# Patient Record
Sex: Male | Born: 1965 | Race: White | Hispanic: No | Marital: Married | Smoking: Never smoker
Health system: Southern US, Community
[De-identification: ages and names within clinical notes are randomized; demographics above are authoritative.]

## PROBLEM LIST (undated history)

## (undated) DIAGNOSIS — G4700 Insomnia, unspecified: Secondary | ICD-10-CM

## (undated) DIAGNOSIS — E291 Testicular hypofunction: Secondary | ICD-10-CM

## (undated) DIAGNOSIS — R7989 Other specified abnormal findings of blood chemistry: Secondary | ICD-10-CM

## (undated) DIAGNOSIS — M545 Low back pain: Secondary | ICD-10-CM

## (undated) HISTORY — PX: HERNIA REPAIR: SHX51

## (undated) HISTORY — DX: Testicular hypofunction: E29.1

## (undated) HISTORY — DX: Other specified abnormal findings of blood chemistry: R79.89

## (undated) HISTORY — DX: Low back pain: M54.5

## (undated) HISTORY — PX: OTHER SURGICAL HISTORY: SHX169

## (undated) HISTORY — DX: Insomnia, unspecified: G47.00

---

## 1998-05-15 ENCOUNTER — Encounter: Admission: RE | Admit: 1998-05-15 | Discharge: 1998-07-03 | Payer: Self-pay

## 1998-07-14 ENCOUNTER — Encounter: Payer: Self-pay | Admitting: Neurosurgery

## 1998-07-14 ENCOUNTER — Ambulatory Visit (HOSPITAL_COMMUNITY): Admission: RE | Admit: 1998-07-14 | Discharge: 1998-07-14 | Payer: Self-pay | Admitting: Neurosurgery

## 1998-08-07 ENCOUNTER — Ambulatory Visit (HOSPITAL_COMMUNITY): Admission: RE | Admit: 1998-08-07 | Discharge: 1998-08-07 | Payer: Self-pay | Admitting: Neurosurgery

## 1998-08-07 ENCOUNTER — Encounter: Payer: Self-pay | Admitting: Neurosurgery

## 1998-08-28 ENCOUNTER — Ambulatory Visit (HOSPITAL_COMMUNITY): Admission: RE | Admit: 1998-08-28 | Discharge: 1998-08-28 | Payer: Self-pay | Admitting: Neurosurgery

## 1998-08-28 ENCOUNTER — Encounter: Payer: Self-pay | Admitting: Neurosurgery

## 2004-02-27 ENCOUNTER — Emergency Department (HOSPITAL_COMMUNITY): Admission: EM | Admit: 2004-02-27 | Discharge: 2004-02-27 | Payer: Self-pay | Admitting: Emergency Medicine

## 2004-03-28 ENCOUNTER — Encounter: Admission: RE | Admit: 2004-03-28 | Discharge: 2004-05-24 | Payer: Self-pay | Admitting: Family Medicine

## 2006-02-07 ENCOUNTER — Ambulatory Visit: Payer: Self-pay | Admitting: Family Medicine
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
## 2006-06-10 ENCOUNTER — Ambulatory Visit: Payer: Self-pay | Admitting: Internal Medicine

## 2006-07-01 ENCOUNTER — Ambulatory Visit: Payer: Self-pay | Admitting: Family Medicine

## 2006-07-01 LAB — CONVERTED CEMR LAB
PSA: 0.58 ng/mL (ref 0.10–4.00)
TSH: 1.73 microintl units/mL (ref 0.35–5.50)

## 2006-09-30 DIAGNOSIS — R7989 Other specified abnormal findings of blood chemistry: Secondary | ICD-10-CM | POA: Insufficient documentation

## 2006-09-30 DIAGNOSIS — G4700 Insomnia, unspecified: Secondary | ICD-10-CM

## 2006-09-30 HISTORY — DX: Other specified abnormal findings of blood chemistry: R79.89

## 2006-09-30 HISTORY — DX: Insomnia, unspecified: G47.00

## 2006-10-27 ENCOUNTER — Ambulatory Visit: Payer: Self-pay | Admitting: Family Medicine

## 2006-10-27 DIAGNOSIS — B356 Tinea cruris: Secondary | ICD-10-CM | POA: Insufficient documentation

## 2006-10-27 DIAGNOSIS — E291 Testicular hypofunction: Secondary | ICD-10-CM

## 2006-10-27 HISTORY — DX: Testicular hypofunction: E29.1

## 2006-10-28 ENCOUNTER — Encounter (INDEPENDENT_AMBULATORY_CARE_PROVIDER_SITE_OTHER): Payer: Self-pay | Admitting: Family Medicine

## 2006-10-28 LAB — CONVERTED CEMR LAB: Testosterone: 338.23 ng/dL — ABNORMAL LOW (ref 350.00–890)

## 2006-10-29 ENCOUNTER — Telehealth (INDEPENDENT_AMBULATORY_CARE_PROVIDER_SITE_OTHER): Payer: Self-pay | Admitting: *Deleted

## 2006-10-29 LAB — CONVERTED CEMR LAB
Sex Hormone Binding: 16 nmol/L (ref 13–71)
Testosterone Free: 76.1 pg/mL (ref 47.0–244.0)
Testosterone-% Free: 2.8 % (ref 1.6–2.9)
Testosterone: 274.9 ng/dL — ABNORMAL LOW (ref 350–890)

## 2007-03-26 ENCOUNTER — Encounter (INDEPENDENT_AMBULATORY_CARE_PROVIDER_SITE_OTHER): Payer: Self-pay | Admitting: Family Medicine

## 2007-07-02 ENCOUNTER — Telehealth (INDEPENDENT_AMBULATORY_CARE_PROVIDER_SITE_OTHER): Payer: Self-pay | Admitting: *Deleted

## 2007-07-20 ENCOUNTER — Telehealth: Payer: Self-pay | Admitting: Family Medicine

## 2007-11-10 ENCOUNTER — Telehealth (INDEPENDENT_AMBULATORY_CARE_PROVIDER_SITE_OTHER): Payer: Self-pay | Admitting: *Deleted

## 2007-11-16 ENCOUNTER — Telehealth (INDEPENDENT_AMBULATORY_CARE_PROVIDER_SITE_OTHER): Payer: Self-pay | Admitting: *Deleted

## 2007-12-18 ENCOUNTER — Ambulatory Visit: Payer: Self-pay | Admitting: Family Medicine

## 2007-12-18 ENCOUNTER — Telehealth (INDEPENDENT_AMBULATORY_CARE_PROVIDER_SITE_OTHER): Payer: Self-pay | Admitting: *Deleted

## 2007-12-18 DIAGNOSIS — M545 Low back pain, unspecified: Secondary | ICD-10-CM | POA: Insufficient documentation

## 2007-12-18 HISTORY — DX: Low back pain, unspecified: M54.50

## 2007-12-23 ENCOUNTER — Telehealth (INDEPENDENT_AMBULATORY_CARE_PROVIDER_SITE_OTHER): Payer: Self-pay | Admitting: *Deleted

## 2007-12-23 ENCOUNTER — Encounter (INDEPENDENT_AMBULATORY_CARE_PROVIDER_SITE_OTHER): Payer: Self-pay | Admitting: *Deleted

## 2007-12-23 LAB — CONVERTED CEMR LAB
ALT: 28 units/L (ref 0–53)
AST: 20 units/L (ref 0–37)
Albumin: 4.5 g/dL (ref 3.5–5.2)
Alkaline Phosphatase: 53 units/L (ref 39–117)
Bilirubin, Direct: 0.1 mg/dL (ref 0.0–0.3)
Indirect Bilirubin: 0.3 mg/dL (ref 0.0–0.9)
PSA: 0.54 ng/mL (ref 0.10–4.00)
Sex Hormone Binding: 16 nmol/L (ref 13–71)
Testosterone Free: 43 pg/mL — ABNORMAL LOW (ref 47.0–244.0)
Testosterone-% Free: 2.7 % (ref 1.6–2.9)
Testosterone: 160.79 ng/dL — ABNORMAL LOW (ref 350–890)
Total Bilirubin: 0.4 mg/dL (ref 0.3–1.2)
Total Protein: 7.2 g/dL (ref 6.0–8.3)

## 2007-12-24 ENCOUNTER — Encounter (INDEPENDENT_AMBULATORY_CARE_PROVIDER_SITE_OTHER): Payer: Self-pay | Admitting: *Deleted

## 2008-01-01 ENCOUNTER — Encounter: Payer: Self-pay | Admitting: Family Medicine

## 2008-03-15 ENCOUNTER — Telehealth (INDEPENDENT_AMBULATORY_CARE_PROVIDER_SITE_OTHER): Payer: Self-pay | Admitting: *Deleted

## 2008-04-12 ENCOUNTER — Ambulatory Visit: Payer: Self-pay | Admitting: Family Medicine

## 2008-04-12 DIAGNOSIS — J029 Acute pharyngitis, unspecified: Secondary | ICD-10-CM | POA: Insufficient documentation

## 2008-04-12 LAB — CONVERTED CEMR LAB: Rapid Strep: NEGATIVE

## 2008-04-14 ENCOUNTER — Encounter: Payer: Self-pay | Admitting: Family Medicine

## 2008-04-18 ENCOUNTER — Encounter (INDEPENDENT_AMBULATORY_CARE_PROVIDER_SITE_OTHER): Payer: Self-pay | Admitting: *Deleted

## 2008-07-27 ENCOUNTER — Ambulatory Visit: Payer: Self-pay | Admitting: Family Medicine

## 2008-07-27 DIAGNOSIS — J309 Allergic rhinitis, unspecified: Secondary | ICD-10-CM | POA: Insufficient documentation

## 2008-07-27 LAB — CONVERTED CEMR LAB: Rapid Strep: POSITIVE

## 2008-09-07 ENCOUNTER — Telehealth (INDEPENDENT_AMBULATORY_CARE_PROVIDER_SITE_OTHER): Payer: Self-pay | Admitting: *Deleted

## 2008-09-08 ENCOUNTER — Encounter: Payer: Self-pay | Admitting: Family Medicine

## 2008-10-10 ENCOUNTER — Telehealth (INDEPENDENT_AMBULATORY_CARE_PROVIDER_SITE_OTHER): Payer: Self-pay | Admitting: *Deleted

## 2009-01-31 ENCOUNTER — Telehealth: Payer: Self-pay | Admitting: Family Medicine

## 2009-04-05 ENCOUNTER — Ambulatory Visit: Payer: Self-pay | Admitting: Family Medicine

## 2010-04-04 ENCOUNTER — Encounter: Payer: Self-pay | Admitting: Family Medicine

## 2010-04-04 ENCOUNTER — Ambulatory Visit: Payer: Self-pay | Admitting: Family Medicine

## 2010-04-05 LAB — CONVERTED CEMR LAB
ALT: 31 units/L (ref 0–53)
AST: 24 units/L (ref 0–37)
Albumin: 4.3 g/dL (ref 3.5–5.2)
Alkaline Phosphatase: 46 units/L (ref 39–117)
BUN: 20 mg/dL (ref 6–23)
Basophils Absolute: 0.1 10*3/uL (ref 0.0–0.1)
Basophils Relative: 1.1 % (ref 0.0–3.0)
Bilirubin, Direct: 0.1 mg/dL (ref 0.0–0.3)
CO2: 33 meq/L — ABNORMAL HIGH (ref 19–32)
Calcium: 9.6 mg/dL (ref 8.4–10.5)
Chloride: 104 meq/L (ref 96–112)
Cholesterol: 216 mg/dL — ABNORMAL HIGH (ref 0–200)
Creatinine, Ser: 1.2 mg/dL (ref 0.4–1.5)
Direct LDL: 152.7 mg/dL
Eosinophils Absolute: 0.3 10*3/uL (ref 0.0–0.7)
Eosinophils Relative: 4.7 % (ref 0.0–5.0)
GFR calc non Af Amer: 68.48 mL/min (ref >60.00)
Glucose, Bld: 73 mg/dL (ref 70–99)
HCT: 42.3 % (ref 39.0–52.0)
HDL: 41.7 mg/dL (ref >39.00)
Hemoglobin: 14.7 g/dL (ref 13.0–17.0)
Lymphocytes Relative: 41.4 % (ref 12.0–46.0)
Lymphs Abs: 2.9 10*3/uL (ref 0.7–4.0)
MCHC: 34.8 g/dL (ref 30.0–36.0)
MCV: 96.3 fL (ref 78.0–100.0)
Monocytes Absolute: 0.6 10*3/uL (ref 0.1–1.0)
Monocytes Relative: 8.1 % (ref 3.0–12.0)
Neutro Abs: 3.1 10*3/uL (ref 1.4–7.7)
Neutrophils Relative %: 44.7 % (ref 43.0–77.0)
Platelets: 291 10*3/uL (ref 150.0–400.0)
Potassium: 4.1 meq/L (ref 3.5–5.1)
RBC: 4.39 M/uL (ref 4.22–5.81)
RDW: 13.1 % (ref 11.5–14.6)
Sodium: 142 meq/L (ref 135–145)
TSH: 1.33 microintl units/mL (ref 0.35–5.50)
Total Bilirubin: 1 mg/dL (ref 0.3–1.2)
Total CHOL/HDL Ratio: 5
Total Protein: 7 g/dL (ref 6.0–8.3)
Triglycerides: 118 mg/dL (ref 0.0–149.0)
VLDL: 23.6 mg/dL (ref 0.0–40.0)
WBC: 6.9 10*3/uL (ref 4.5–10.5)

## 2010-04-10 ENCOUNTER — Encounter: Payer: Self-pay | Admitting: Family Medicine

## 2010-04-10 ENCOUNTER — Ambulatory Visit
Admission: RE | Admit: 2010-04-10 | Discharge: 2010-04-10 | Payer: Self-pay | Source: Home / Self Care | Attending: Family Medicine | Admitting: Family Medicine

## 2010-04-12 LAB — CONVERTED CEMR LAB: Fecal Occult Bld: NEGATIVE

## 2010-05-15 NOTE — Letter (Signed)
Summary: Primary Care Consult Scheduled Letter  [name] [place]  [address] [place], [place] [address]   Phone: [phone]  Fax: [phone]      12/24/2007 MRN: [id number]  Raymond Decker [address] [place], [place]  [address]    Dear Raymond Decker,      We have scheduled an appointment for you.  At the recommendation of Dr.Lowne, we have scheduled you an appt  with  [facility]/ Dr. Ailene Rud on  September 17th at 10:30am. Their address is [address]nd [address]. The office phone number is [phone].  If this appointment day and time is not convenient for you, please feel free to call the office of the doctor you are being referred to at the number listed above and reschedule the appointment.     It is important for you to keep your scheduled appointments. We are here to make sure you are given good patient care. If you have questions or you have made changes to your appointment, please notify us at  [phone], ask for Tiffany.    Thank you,  Patient Care Coordinator [place] [facility]

## 2010-05-15 NOTE — Assessment & Plan Note (Signed)
Summary: acute only for a sore throat//ph   Vital Signs:  Patient Profile:   45 Years Old Male Weight:      182.6 pounds Temp:     97.4 degrees F oral Pulse rate:   90 / minute BP sitting:   100 / 72  (left arm)  Pt. in pain?   no  Vitals Entered By: Jeremy Johann CMA (April 12, 2008 3:57 PM)                  Chief Complaint:  sore throat.  History of Present Illness: Pt here c/o sore throat since Tuesday.  No other complaints.    Current Allergies (reviewed today): No known allergies   Past Medical History:    Reviewed history from 12/18/2007 and no changes required:       Current Problems:        BACK PAIN, LUMBAR (ICD-724.2)       TINEA CRURIS (ICD-110.3)       HYPOGONADISM, MALE (ICD-257.2)       INSOMNIA (ICD-780.52)       HYPERGLYCEMIA (ICD-790.6)            Review of Systems      See HPI   Physical Exam  General:     Well-developed,well-nourished,in no acute distress; alert,appropriate and cooperative throughout examination Head:     Normocephalic and atraumatic without obvious abnormalities. No apparent alopecia or balding. Ears:     External ear exam shows no significant lesions or deformities.  Otoscopic examination reveals clear canals, tympanic membranes are intact bilaterally without bulging, retraction, inflammation or discharge. Hearing is grossly normal bilaterally. Nose:     External nasal examination shows no deformity or inflammation. Nasal mucosa are pink and moist without lesions or exudates. Mouth:     pharyngeal erythema and postnasal drip.   Neck:     No deformities, masses, or tenderness noted. Lungs:     Normal respiratory effort, chest expands symmetrically. Lungs are clear to auscultation, no crackles or wheezes. Heart:     Normal rate and regular rhythm. S1 and S2 normal without gallop, murmur, click, rub or other extra sounds. Skin:     Intact without suspicious lesions or rashes Cervical Nodes:     No  lymphadenopathy noted Psych:     Cognition and judgment appear intact. Alert and cooperative with normal attention span and concentration. No apparent delusions, illusions, hallucinations    Impression & Recommendations:  Problem # 1:  ACUTE PHARYNGITIS (ICD-462)  His updated medication list for this problem includes:    Amoxicillin 875 Mg Tabs (Amoxicillin) .[name]... 1 by mouth two times a day  Orders: T-Culture, Throat ([address]) Rapid Strep ([address]) Instructed to complete antibiotics and call if not improved in 48 hours.   Complete Medication List: 1)  Lunesta 3 Mg Tabs (Eszopiclone) 2)  Testim 1 % Gel (Testosterone) .... Use as directed 3)  Amoxicillin 875 Mg Tabs (Amoxicillin) .[name].. 1 by mouth two times a day    Prescriptions: AMOXICILLIN 875 MG TABS (AMOXICILLIN) 1 by mouth two times a day  #20 x 0   Entered and Authorized by:   Loreen Freud DO   Signed by:   Loreen Freud DO on 04/12/2008   Method used:   [facility]        [facility] [phone]* (retail)       [address]       [place],  [place]  [address]       Ph: [phone]       Fax: [phone]   RxID:   [email or web address]  ] Laboratory Results   Date/Time Reported: April 12, 2008 4:11 PM  Other Tests  Rapid Strep: negative   Appended Document: acute only for a sore throat//ph

## 2010-05-15 NOTE — Progress Notes (Signed)
  Phone Note From Pharmacy   Summary of Call: Pharmacy called office with patient there and he cannot afford the androgel and doesn't want to use the patch.  Patient wants to wait until labs come back to decide what to do.  Dr. Laury Axon okay with this.  Ardyth Man  December 18, 2007 4:33 PM  Initial call taken by: Ardyth Man,  December 18, 2007 4:33 PM

## 2010-05-15 NOTE — Letter (Signed)
Summary: Results Follow-up Letter  [place] [facility]  [address] [place], [place] [address]   Phone: [phone]  Fax: [phone]    04/18/2008        Ulysess Witz [address] [place], [place]  [address]  Dear Mr. Dubach,   The following are the results of your recent test(s):  Test     Result     Pap Smear    Normal_______  Not Normal_____       Comments: _________________________________________________________ Cholesterol LDL(Bad cholesterol):          Your goal is less than:         HDL (Good cholesterol):        Your goal is more than: _________________________________________________________ Other Tests:   _________________________________________________________  Please call for an appointment Or Please see attached Normal Throat Culture._________________________________________________________ _________________________________________________________ _________________________________________________________  Sincerely,  Felecia Deloach CMA [place] [facility]

## 2010-05-15 NOTE — Letter (Signed)
Summary: Results Follow up Letter  [place] [place]  [address] [place], [place] [address]   Phone: [phone]  Fax: [phone]    12/23/2007 MRN: [id number]  Raymond Decker [address] [place], [place]  [address]  Dear Mr. Hoefling,  The following are the results of your recent test(s):  Test         Result    Pap Smear:        Normal _____  Not Normal _____ Comments: ______________________________________________________ Cholesterol: LDL(Bad cholesterol):         Your goal is less than:         HDL (Good cholesterol):       Your goal is more than: Comments:  ______________________________________________________ Mammogram:        Normal _____  Not Normal _____ Comments:  ___________________________________________________________________ Hemoccult:        Normal _____  Not normal _______ Comments:    _____________________________________________________________________ Other Tests:Please see attached labs from 12-18-07. We tried to contact you but was unable to reach you    We routinely do not discuss normal results over the telephone.  If you desire a copy of the results, or you have any questions about this information we can discuss them at your next office visit.   Sincerely,

## 2010-05-15 NOTE — Progress Notes (Signed)
Summary: REFILL  Phone Note Refill Request Message from:  [facility] FAX [phone]  Refills Requested: Medication #1:  LUNESTA 3 MG  TABS Initial call taken by: Barb Merino,  October 10, 2008 10:36 AM  Follow-up for Phone Call        please advise in absent of dr Laury Axon  last OV 07-27-08, last filled #30 3 03-16-08.....Marland KitchenMarland KitchenFelecia Deloach CMA  October 11, 2008 10:33 AM  Additional Follow-up for Phone Call Additional follow up Details #1::        ok for #30, no refills. Additional Follow-up by: Neena Rhymes MD,  October 11, 2008 10:34 AM      Prescriptions: Alfonso Patten 3 MG  TABS (ESZOPICLONE)   #30 x 0   Entered by:   Jeremy Johann CMA   Authorized by:   Neena Rhymes MD   Signed by:   Jeremy Johann CMA on 10/11/2008   Method used:   Printed then faxed to ...       [facility] [phone]* (retail)       [address]       [place], [place]  [address]       Ph: [phone]       Fax: [phone]   RxID:   [id number]

## 2010-05-15 NOTE — Medication Information (Signed)
Summary: escript prescription  escript prescription   Imported By: Freddy Jaksch 04/15/2007 10:24:14  _____________________________________________________________________  External Attachment:    Type:   Image     Comment:   External Document

## 2010-05-15 NOTE — Progress Notes (Signed)
Summary: LOWNE--REFILL lunesta  Phone Note Refill Request   Refills Requested: Medication #1:  LUNESTA 3 MG  TABS [facility] [address][phone]--LAST FILLED-12/10/07  Initial call taken by: Freddy Jaksch,  March 15, 2008 9:08 AM  Follow-up for Phone Call        last refill #30 3 refill 10/27/06 last ov 12/18/07 .Kandice Hams  March 15, 2008 2:34 PM  Follow-up by: Kandice Hams,  March 15, 2008 2:34 PM  Additional Follow-up for Phone Call Additional follow up Details #1::        ok Additional Follow-up by: Loreen Freud DO,  March 15, 2008 5:31 PM      Prescriptions: LUNESTA 3 MG  TABS (ESZOPICLONE)   #30 x 3   Entered by:   Kandice Hams   Authorized by:   Loreen Freud DO   Signed by:   Kandice Hams on 03/16/2008   Method used:   Printed then faxed to ...       [facility] [phone]* (retail)       [address]       [place], [place]  [address]       Ph: [phone]       Fax: [phone]   RxID:   [id number]

## 2010-05-15 NOTE — Progress Notes (Signed)
Summary: LUNESTA//LOWNE  Phone Note Refill Request Message from:  Pharmacy [facility] [phone]  Refills Requested: Medication #1:  LUNESTA 3 MG  TABS LAST REFILL #30 X 0 ON 10/11/08, LAST OV 07/27/08.  PHARMACY COMMENTS: HAS ONLY 10 TABS LEFT ON REFILL--NEEDS REFILL  Initial call taken by: Kandice Hams,  January 31, 2009 9:44 AM  Follow-up for Phone Call        ok to refill x1 Follow-up by: Loreen Freud DO,  January 31, 2009 10:13 AM    Prescriptions: LUNESTA 3 MG  TABS (ESZOPICLONE)   #30 x 0   Entered by:   Jeremy Johann CMA   Authorized by:   Loreen Freud DO   Signed by:   Jeremy Johann CMA on 01/31/2009   Method used:   Printed then faxed to ...       [facility] [phone]* (retail)       [address]       [place], [place]  [address]       Ph: [phone]       Fax: [phone]   RxID:   [id number]

## 2010-05-15 NOTE — Assessment & Plan Note (Signed)
Summary: rash on thigh.cbs   Vital Signs:  Patient Profile:   45 Years Old Male Weight:      174.38 pounds Temp:     98.2 degrees F oral Pulse rate:   68 / minute Resp:     14 per minute BP sitting:   120 / 80  (right arm)  Pt. in pain?   no  Vitals Entered By: Ardyth Man (October 27, 2006 12:48 PM)                Chief Complaint:  Rash and pt. thinks jock itch.  History of Present Illness: Patient reports he started jogging regularly last several months. Noticed a pruritic groin rash. Has used OTC antifungal and hydrocortisone with some relieve, but it recurs   Also has  history of hypogonadism. Using Androgel daily without side effects. Need refill  Also history of insomnia and currently on Lunesta with good results. No significant side effects   History of anxiety. Taking celexa regularly. Would like refill.  Stable on current dose.        Physical Exam  General:     Well-developed,well-nourished,in no acute distress; alert,appropriate and cooperative throughout examination Skin:     mildly erythematous macular/papular rash inquinal crease and groin Psych:     Cognition and judgment appear intact. Alert and cooperative with normal attention span and concentration. No apparent delusions, illusions, hallucinations    Impression & Recommendations:  Problem # 1:  TINEA CRURIS (ICD-110.3) 1. Diflucan 150 mg weekly for 3 weeks. 2. May use Gold Balm Medicated after rash resolves to decrease moisture 3. F/u if no improvement  Problem # 2:  HYPOGONADISM, MALE (ICD-257.2) Refill Androgel Orders: TLB-Testosterone, Total (84403-TESTO)   Problem # 3:  INSOMNIA (ICD-780.52) Stable His updated medication list for this problem includes:    Lunesta 3 Mg Tabs (Eszopiclone)   Medications Added to Medication List This Visit: 1)  Diflucan 150 Mg Tabs (Fluconazole) .[name].. 1 by mouth weekly for 3 weeks     Prescriptions: DIFLUCAN 150 MG  TABS (FLUCONAZOLE) 1  by mouth weekly for 3 weeks  #3 x 0   Entered and Authorized by:   Leanne Chang MD   Signed by:   Leanne Chang MD on 10/27/2006   Method used:   Print then Give to Patient   RxID:   [id number] ANDROGEL PUMP 1 %  GEL (TESTOSTERONE)   #QS x 6   Entered and Authorized by:   Leanne Chang MD   Signed by:   Leanne Chang MD on 10/27/2006   Method used:   Print then Give to Patient   RxID:   [id number] LUNESTA 3 MG  TABS (ESZOPICLONE)   #30 x 3   Entered and Authorized by:   Leanne Chang MD   Signed by:   Leanne Chang MD on 10/27/2006   Method used:   Print then Give to Patient   RxID:   [id number] CITALOPRAM HYDROBROMIDE 20 MG TABS (CITALOPRAM HYDROBROMIDE)   #30 x 6   Entered and Authorized by:   Leanne Chang MD   Signed by:   Leanne Chang MD on 10/27/2006   Method used:   Print then Give to Patient   RxID:   [id number]

## 2010-05-15 NOTE — Assessment & Plan Note (Signed)
Summary: acute - sore throat temp/cbs   Vital Signs:  Patient profile:   45 year old male Weight:      179.6 pounds Temp:     98.5 degrees F oral BP sitting:   118 / 80  (left arm)  Vitals Entered By: Doristine Devoid (July 27, 2008 9:36 AM) CC: sore throat xyesterday and fever of 103    History of Present Illness: 45 yo man here today w/ sore throat and fever to 103 yesterday.  pt w/ multiple sick contacts.  + HA, nasal congestion, mild nausea.  Allergies: No Known Drug Allergies  Review of Systems      See HPI  Physical Exam  General:  Well-developed,well-nourished,in no acute distress; alert,appropriate and cooperative throughout examination Head:  Normocephalic and atraumatic without obvious abnormalities. No apparent alopecia or balding. Ears:  TMs retracted bilaterally Nose:  markedly edematous turbinates bilaterally, clear nasal drainage Mouth:  tonsilar edema, erythema, exudates Neck:  No deformities, masses, or tenderness noted. Lungs:  Normal respiratory effort, chest expands symmetrically. Lungs are clear to auscultation, no crackles or wheezes. Heart:  Normal rate and regular rhythm. S1 and S2 normal without gallop, murmur, click, rub or other extra sounds.   Impression & Recommendations:  Problem # 1:  ACUTE PHARYNGITIS (ICD-462) Assessment Unchanged  pt w/ + strep test in office.  shot of bicillin given.  reviewed supportive care and red flags that should prompt return.  Pt expresses understanding and is in agreement w/ this plan.  Orders: Admin of Therapeutic Inj  intramuscular or subcutaneous ([address]) Bicillin LA 1.2 million units Injection (J0570) Rapid Strep ([address])  Problem # 2:  RHINITIS (ICD-477.9) Assessment: New markedly edematous turbinates, start nasal steroid spray. His updated medication list for this problem includes:    Fluticasone Propionate 50 Mcg/act Susp (Fluticasone propionate) .[name]... 2 sprays each nostril once daily  Complete  Medication List: 1)  Lunesta 3 Mg Tabs (Eszopiclone) 2)  Testim 1 % Gel (Testosterone) .... Use as directed 3)  Fluticasone Propionate 50 Mcg/act Susp (Fluticasone propionate) .... 2 sprays each nostril once daily  Patient Instructions: 1)  The shot you got today should take care of the strep- if no improvement in the next few days please call 2)  Tylenol alternating w/ ibuprofen for pain and fever 3)  Use the steroid nasal spray as directed to help decrease drainage and congestion 4)  Plenty of fluids 5)  Call with any questions or concerns 6)  Hang in there! Prescriptions: FLUTICASONE PROPIONATE 50 MCG/ACT  SUSP (FLUTICASONE PROPIONATE) 2 sprays each nostril once daily  #1 x 3   Entered and Authorized by:   Neena Rhymes MD   Signed by:   Neena Rhymes MD on 07/27/2008   Method used:   Electronically to        [facility] [phone]* (retail)       [address]       [place], [place]  [address]       Ph: [phone]       Fax: [phone]   RxID:   [id number]    Medication Administration  Injection # 1:    Medication: Bicillin LA 1.2 million units Injection    Diagnosis: ACUTE PHARYNGITIS (ICD-462)    Route: IM    Site: RUOQ gluteus    Exp Date: 01/14/2011    Lot #: [id number]    Mfr: [employer]    Given by: Tama Gander  Yetta Barre (July 27, 2008 9:56 AM)  Orders Added: 1)  Admin of Therapeutic Inj  intramuscular or subcutaneous [[address]] 2)  Bicillin LA 1.2 million units Injection [J0570] 3)  Est. Patient Level III [[address]] 4)  Rapid Strep [[address]]  Laboratory Results    Other Tests  Rapid Strep: positive

## 2010-05-15 NOTE — Assessment & Plan Note (Signed)
Summary: REFILL ON MEDS/CDJ   Vital Signs:  Patient Profile:   45 Years Old Male Weight:      182.50 pounds Temp:     98.2 degrees F oral Pulse rate:   72 / minute Resp:     16 per minute BP sitting:   110 / 80  (left arm)  Pt. in pain?   no  Vitals Entered By: Ardyth Man (December 18, 2007 3:24 PM)                  Chief Complaint:  med refill.  History of Present Illness: Pt here for med refill.  He does not like the patch and would like to go back to the pump--androgel.  he is c/o fatigue and decreased libido.    Pt also c/o Low back pain after running at beach.     Current Allergies: No known allergies   Past Medical History:    Current Problems:     BACK PAIN, LUMBAR (ICD-724.2)    TINEA CRURIS (ICD-110.3)    HYPOGONADISM, MALE (ICD-257.2)    INSOMNIA (ICD-780.52)    HYPERGLYCEMIA (ICD-790.6)        Risk Factors: Tobacco use:  never Alcohol use:  yes   Review of Systems      See HPI   Physical Exam  General:     Well-developed,well-nourished,in no acute distress; alert,appropriate and cooperative throughout examination Lungs:     Normal respiratory effort, chest expands symmetrically. Lungs are clear to auscultation, no crackles or wheezes. Heart:     normal rate and regular rhythm.   Msk:     normal ROM, no joint tenderness, no joint swelling, no joint warmth, no redness over joints, no joint deformities, no joint instability, and no crepitation.   Extremities:     No clubbing, cyanosis, edema, or deformity noted with normal full range of motion of all joints.   Neurologic:     alert & oriented X3, cranial nerves II-XII intact, gait normal, and DTRs symmetrical and normal.   Skin:     Intact without suspicious lesions or rashes Psych:     Oriented X3 and memory intact for recent and remote.      Impression & Recommendations:  Problem # 1:  HYPOGONADISM, MALE (ICD-257.2)  Orders: Venipuncture ([address]) T- * Misc. Laboratory test  [phone])   Problem # 2:  BACK PAIN, LUMBAR (ICD-724.2)  His updated medication list for this problem includes:    Flexeril 10 Mg Tabs (Cyclobenzaprine hcl) .[name]... 1 by mouth three times a day as needed  Orders: T-Lumbar Spine Complete, 5 Views [phone]) Discussed use of moist heat or ice, modified activities, medications, and stretching/strengthening exercises. Back care instructions given. To be seen in 2 weeks if no improvement; sooner if worsening of symptoms.   Complete Medication List: 1)  Lunesta 3 Mg Tabs (Eszopiclone) 2)  Flexeril 10 Mg Tabs (Cyclobenzaprine hcl) .[name].. 1 by mouth three times a day as needed    Prescriptions: FLEXERIL 10 MG TABS (CYCLOBENZAPRINE HCL) 1 by mouth three times a day as needed  #30 x 0   Entered and Authorized by:   Loreen Freud DO   Signed by:   Loreen Freud DO on 12/18/2007   Method used:   Printed then faxed to ...       [facility] [phone]* (retail)       [address]       [place], [place]  [address]       Ph: [phone]       Fax: [phone]   RxID:   [id number] ANDROGEL PUMP 1 % GEL (TESTOSTERONE) 4  pumps as directed daily  #1 month x 5   Entered and Authorized by:   Loreen Freud DO   Signed by:   Loreen Freud DO on 12/18/2007   Method used:   Print then Give to Patient   RxID:   [id number]  ]

## 2010-05-15 NOTE — Medication Information (Signed)
Summary: Prior Authorization & Approval for Lunesta/[employer]  Prior Authorization & Approval for Lunesta/[employer]   Imported By: Lanelle Bal 09/14/2008 09:31:52  _____________________________________________________________________  External Attachment:    Type:   Image     Comment:   External Document

## 2010-05-15 NOTE — Progress Notes (Signed)
Summary: change medication/DR LOWNE SEE  Phone Note Call from Patient Call back at [phone]   Caller: Patient Reason for Call: Talk to Nurse Summary of Call: dr. Blossom Hoops patient wanted to know if ANDROGEL PUMP 1 % could be changed to androderm or testim. one of these medications would be cheaper for the patient.  (see 07/02/07 phone note Initial call taken by: Charolette Child,  July 20, 2007 2:47 PM  Follow-up for Phone Call        Is this okay with you?  ...................................................................Ardyth Man  July 20, 2007 4:32 PM  Follow-up by: Ardyth Man,  July 20, 2007 4:32 PM  Additional Follow-up for Phone Call Additional follow up Details #1::        androderm patch 5 mg   1 patch at bedtime   #30---  need to recheck level in 1-2 months----  free testosterone, PSA  257.2 Additional Follow-up by: Loreen Freud DO,  July 20, 2007 4:56 PM    Additional Follow-up for Phone Call Additional follow up Details #2::    Patient aware rx called to pharmacy ...................................................................Ardyth Man  July 20, 2007 5:11 PM  Follow-up by: Ardyth Man,  July 20, 2007 5:11 PM  New/Updated Medications: ANDRODERM 5 MG/24HR  PT24 (TESTOSTERONE) Apply one patch at bedtime   Prescriptions: ANDRODERM 5 MG/24HR  PT24 (TESTOSTERONE) Apply one patch at bedtime  #30 x 1   Entered by:   Ardyth Man   Authorized by:   Loreen Freud DO   Signed by:   Ardyth Man on 07/20/2007   Method used:   Telephoned to ...       [facility] [phone]*       [address]       [place], [place]  [address]       Ph: [phone]       Fax: [phone]   RxID:   [id number]

## 2010-05-15 NOTE — Consult Note (Signed)
Summary: [facility]  [facility]   Imported By: Lanelle Bal 01/14/2008 13:08:08  _____________________________________________________________________  External Attachment:    Type:   Image     Comment:   External Document

## 2010-05-15 NOTE — Progress Notes (Signed)
Summary: PRIOR AUTH APPROVED LUNESTA// BCBS  Phone Note Refill Request Message from:  [facility]fax#[phone]  Refills Requested: Medication #1:  LUNESTA 3 MG  TABS please submit a prior authorization request [id number] [employer]BS [id number]  Initial call taken by: Barb Merino,  Sep 07, 2008 2:15 PM  Follow-up for Phone Call        PRIOR AUTH IN PROCESS AWAITING FAX..............Marland KitchenFelecia Deloach CMA  Sep 08, 2008 9:01 AM PRIOR AUTH FAXED BACK AWAITING RESPONSE.........Marland KitchenFelecia Deloach CMA  Sep 08, 2008 11:08 AM  Additional Follow-up for Phone Call Additional follow up Details #1::        LUNESTA APPROVED 09-09-08 TO 06-05-11,  APPROVAL LETTER FAXED TO PHARMACY,SCAN TO CHART.........Marland KitchenFelecia Deloach CMA  Sep 08, 2008 2:44 PM

## 2010-05-15 NOTE — Progress Notes (Signed)
  Phone Note Outgoing Call   Call placed by: Ardyth Man,  October 29, 2006 4:36 PM Call placed to: Patient Summary of Call: [facility] ...................................................................Ardyth Man  October 29, 2006 4:36 PM   Follow-up for Phone Call        Pt. aware ...................................................................Ardyth Man  October 31, 2006 12:06 PM  Follow-up by: Ardyth Man,  October 31, 2006 12:06 PM

## 2010-05-15 NOTE — Progress Notes (Signed)
Summary: LOWNE--REFILL  Phone Note Refill Request   Refills Requested: Medication #1:  CITALOPRAM HYDROBROMIDE 20 MG TABS [facility] [address][phone]  Initial call taken by: Freddy Jaksch,  November 16, 2007 3:21 PM      Prescriptions: CITALOPRAM HYDROBROMIDE 20 MG TABS (CITALOPRAM HYDROBROMIDE)   #30 x 0   Entered by:   Kandice Hams   Authorized by:   Loreen Freud DO   Signed by:   Kandice Hams on 11/16/2007   Method used:   Telephoned to ...       [facility] [phone]*       [address]       [place], [place]  [address]       Ph: [phone]       Fax: [phone]   RxID:   [id number]

## 2010-05-15 NOTE — Progress Notes (Signed)
Summary: different rx- dr Blossom Hoops  Phone Note Call from Patient Call back at [phone]   Caller: Patient Summary of Call: rx was called in for androgel but his ins doesnt cover - he wants to know if somthing else can be called in  Initial call taken by: Okey Regal [place],  July 02, 2007 5:06 PM  Follow-up for Phone Call        Spoke with pharmacist Lennice Sites [facility] [address] insurance did not deny rx it was run through and was Not Denied, med dis cost pt $230.00 where in Nov med cost him $30.00. Could be possible change in formulary, pharmacist said she did tell pt to check with his ins co [facility] to find out, but ins did not deny med just a higher copay...Marland KitchenMarland KitchenPt informed of this and he will call [facility] is hthere a change in formulary? if so what is comparable that is cheaper and let us know...................................................................Marland KitchenKandice Hams  July 02, 2007 5:49 PM   Follow-up by: Kandice Hams,  July 02, 2007 5:45 PM

## 2010-05-15 NOTE — Progress Notes (Signed)
Summary: call-12-23-07/fd  Phone Note Outgoing Call Call back at [facility] Phone [phone] Call back at Work Phone [phone]   Call placed by: [facility] CMA,  December 23, 2007 2:49 PM Call placed to: Patient Summary of Call: tried to call pt phone disconnected and no one by that name at work #. Mail labs to pt  Initial call taken by: [facility] CMA,  December 23, 2007 2:52 PM

## 2010-05-15 NOTE — Progress Notes (Signed)
Summary: lowne--refill  Phone Note Refill Request   Refills Requested: Medication #1:  CITALOPRAM HYDROBROMIDE 20 MG TABS  Medication #2:  ANDRODERM 5 MG/24HR  PT24 Apply one patch at bedtime [facility] [address][phone] [phone]  Initial call taken by: Freddy Jaksch,  November 10, 2007 8:35 AM      Prescriptions: CITALOPRAM HYDROBROMIDE 20 MG TABS (CITALOPRAM HYDROBROMIDE)   #30 x 6   Entered by:   Kandice Hams   Authorized by:   Loreen Freud DO   Signed by:   Kandice Hams on 11/10/2007   Method used:   Electronically sent to ...       [facility] [phone]*       [address]       [place], [place]  [address]       Ph: [phone]       Fax: [phone]   RxID:   [id number]

## 2010-05-17 NOTE — Letter (Signed)
Summary: [name]: Immunoassay Fecal Occult Blood (iFOB) Order Form  [facility] [place]  [address] [place], [place] [address]   Phone: [phone]  Fax: [phone]      [facility]: Immunoassay Fecal Occult Blood (iFOB) Order Form   April 10, 2010 MRN: [id number]   Raymond Decker [birth year]   Physicican Name: Dr.Lowne  Diagnosis Code: v56.71      [name] CMA (AAMA)

## 2010-05-17 NOTE — Assessment & Plan Note (Signed)
Summary: cpx/cbs   Vital Signs:  Patient profile:   45 year old male Height:      68 inches Weight:      176.2 pounds BMI:     26.89 Pulse rate:   80 / minute Pulse rhythm:   regular BP sitting:   112 / 70  (left arm) Cuff size:   large  Vitals Entered By: [name] CMA Duncan Dull) (April 04, 2010 10:09 AM) CC: CPX/Fasting--- no problems   History of Present Illness: Pt here for cpe and labs.  Pt only complaint is he is not sleeping again.  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    Preventive Screening-Counseling & Management  Alcohol-Tobacco     Alcohol drinks/day: <1     Smoking Status: never  Caffeine-Diet-Exercise     Caffeine use/day: 5     Does Patient Exercise: yes     Type of exercise: cardio, weights      Exercise (avg: min/session): >60     Times/week: 5  Hep-HIV-STD-Contraception     Dental Visit-last 6 months yes     Dental Care Counseling: to seek dental care; no dental care within six months      Sexual History:  currently monogamous.    Current Medications (verified): 1)  Androgel Pump 1.25 Gm/act (1%) Gel (Testosterone) .... Use As Directed 2)  Fluticasone Propionate 50 Mcg/act  Susp (Fluticasone Propionate) .... 2 Sprays Each Nostril Once Daily 3)  Ambien 10 Mg Tabs (Zolpidem Tartrate) .[name].. 1 By Mouth At Bedtime As Needed  Allergies (verified): No Known Drug Allergies  Past History:  Past Medical History: Last updated: 12/18/2007 Current Problems:  BACK PAIN, LUMBAR (ICD-724.2) TINEA CRURIS (ICD-110.3) HYPOGONADISM, MALE (ICD-257.2) INSOMNIA (ICD-780.52) HYPERGLYCEMIA (ICD-790.6)  Family History: Last updated: 09/30/2006 Family History Other cancer-pancreatic  Social History: Last updated: 09/30/2006 Married Never Smoked Alcohol use-yes-rare  Risk Factors: Alcohol Use: <1 (04/04/2010) Caffeine Use: 5 (04/04/2010) Exercise: yes (04/04/2010)  Risk Factors: Smoking Status: never (04/04/2010)  Family  History: Reviewed history from 09/30/2006 and no changes required. Family History Other cancer-pancreatic  Social History: Reviewed history from 09/30/2006 and no changes required. Married Never Smoked Alcohol use-yes-rare Does Patient Exercise:  yes Caffeine use/day:  5 Dental Care w/in 6 mos.:  yes Sexual History:  currently monogamous  Review of Systems      See HPI General:  Denies chills, fatigue, fever, loss of appetite, malaise, sleep disorder, sweats, weakness, and weight loss. Eyes:  Denies blurring, discharge, double vision, eye irritation, eye pain, halos, itching, light sensitivity, red eye, vision loss-1 eye, and vision loss-both eyes; optho--q2y. ENT:  Denies decreased hearing, difficulty swallowing, ear discharge, earache, hoarseness, nasal congestion, nosebleeds, postnasal drainage, ringing in ears, sinus pressure, and sore throat. CV:  Denies bluish discoloration of lips or nails, chest pain or discomfort, difficulty breathing at night, difficulty breathing while lying down, fainting, fatigue, leg cramps with exertion, lightheadness, near fainting, palpitations, shortness of breath with exertion, swelling of feet, swelling of hands, and weight gain. Resp:  Denies chest discomfort, chest pain with inspiration, cough, coughing up blood, excessive snoring, hypersomnolence, morning headaches, pleuritic, shortness of breath, sputum productive, and wheezing. GI:  Denies abdominal pain, bloody stools, change in bowel habits, constipation, dark tarry stools, diarrhea, excessive appetite, gas, hemorrhoids, indigestion, loss of appetite, nausea, vomiting, vomiting blood, and yellowish skin color. GU:  Denies decreased libido, discharge, dysuria, erectile dysfunction, genital sores, hematuria, incontinence, nocturia, urinary frequency, and urinary hesitancy. MS:  Denies joint pain, joint redness, joint swelling, loss of strength, low back pain, mid back pain, muscle aches, muscle ,  cramps, muscle weakness, stiffness, and thoracic pain. Derm:  Denies changes in color of skin, changes in nail beds, dryness, excessive perspiration, flushing, hair loss, insect bite(s), itching, lesion(s), poor wound healing, and rash; derm --[place] derm . Neuro:  Denies brief paralysis, difficulty with concentration, disturbances in coordination, falling down, headaches, inability to speak, memory loss, numbness, poor balance, seizures, sensation of room spinning, tingling, tremors, visual disturbances, and weakness. Psych:  Denies alternate hallucination ( auditory/visual), anxiety, depression, easily angered, easily tearful, irritability, mental problems, panic attacks, sense of great danger, suicidal thoughts/plans, thoughts of violence, unusual visions or sounds, and thoughts /plans of harming others. Endo:  Denies cold intolerance, excessive hunger, excessive thirst, excessive urination, heat intolerance, polyuria, and weight change. Heme:  Denies abnormal bruising, bleeding, enlarge lymph nodes, fevers, pallor, and skin discoloration. Allergy:  Denies hives or rash, itching eyes, persistent infections, seasonal allergies, and sneezing.  Physical Exam  General:  Well-developed,well-nourished,in no acute distress; alert,appropriate and cooperative throughout examination Head:  Normocephalic and atraumatic without obvious abnormalities. No apparent alopecia or balding. Eyes:  pupils equal, pupils round, pupils reactive to light, and no injection.   Ears:  External ear exam shows no significant lesions or deformities.  Otoscopic examination reveals clear canals, tympanic membranes are intact bilaterally without bulging, retraction, inflammation or discharge. Hearing is grossly normal bilaterally. Nose:  External nasal examination shows no deformity or inflammation. Nasal mucosa are pink and moist without lesions or exudates. Mouth:  Oral mucosa and oropharynx without lesions or exudates.  Teeth  in good repair. Neck:  No deformities, masses, or tenderness noted. Chest Wall:  No deformities, masses, tenderness or gynecomastia noted. Lungs:  Normal respiratory effort, chest expands symmetrically. Lungs are clear to auscultation, no crackles or wheezes. Heart:  normal rate and no murmur.   Abdomen:  Bowel sounds positive,abdomen soft and non-tender without masses, organomegaly or hernias noted. Rectal:  No external abnormalities noted. Normal sphincter tone. No rectal masses or tenderness. Heme neg brown stool Genitalia:  Testes bilaterally descended without nodularity, tenderness or masses. No scrotal masses or lesions. No penis lesions or urethral discharge. Prostate:  Prostate gland firm and smooth, no enlargement, nodularity, tenderness, mass, asymmetry or induration. Msk:  normal ROM, no joint tenderness, no joint swelling, no joint warmth, no redness over joints, no joint deformities, no joint instability, and no crepitation.   Pulses:  R femoral normal, R posterior tibial normal, R dorsalis pedis normal, R carotid normal, L posterior tibial normal, L dorsalis pedis normal, and L carotid normal.   Extremities:  No clubbing, cyanosis, edema, or deformity noted with normal full range of motion of all joints.   Neurologic:  alert & oriented X3, strength normal in all extremities, and gait normal.   Skin:  Intact without suspicious lesions or rashes Cervical Nodes:  No lymphadenopathy noted Psych:  Cognition and judgment appear intact. Alert and cooperative with normal attention span and concentration. No apparent delusions, illusions, hallucinations   Impression & Recommendations:  Problem # 1:  PREVENTIVE HEALTH CARE (ICD-V70.0)  Orders: Venipuncture ([address]) TLB-Lipid Panel (80061-LIPID) TLB-BMP (Basic Metabolic Panel-BMET) (80048-METABOL) TLB-CBC Platelet - w/Differential (85025-CBCD) TLB-Hepatic/Liver Function Pnl (80076-HEPATIC) TLB-TSH (Thyroid Stimulating Hormone)  (84443-TSH) EKG w/ Interpretation (93000)  Reviewed preventive care protocols, scheduled due services, and updated immunizations.  Problem # 2:  HYPOGONADISM, MALE (ICD-257.2) per urology  Problem # 3:  INSOMNIA (ICD-780.52)  The following medications were removed from the medication list:    Lunesta 3 Mg Tabs (Eszopiclone) His updated medication list for this problem includes:    Ambien 10 Mg Tabs (Zolpidem tartrate) .[name]... 1 by mouth at bedtime as needed  Discussed sleep hygiene.   Problem # 4:  HYPERGLYCEMIA (ICD-790.6)  Complete Medication List: 1)  Androgel Pump 1.25 Gm/act (1%) Gel (Testosterone) .... Use as directed 2)  Fluticasone Propionate 50 Mcg/act Susp (Fluticasone propionate) .... 2 sprays each nostril once daily 3)  Ambien 10 Mg Tabs (Zolpidem tartrate) .[name].. 1 by mouth at bedtime as needed  Patient Instructions: 1)  Please schedule a follow-up appointment in 1 year.  Prescriptions: AMBIEN 10 MG TABS (ZOLPIDEM TARTRATE) 1 by mouth at bedtime as needed  #30 x 0   Entered and Authorized by:   Loreen Freud DO   Signed by:   Loreen Freud DO on 04/04/2010   Method used:   Print then Give to Patient   RxID:   [id number]    Orders Added: 1)  Venipuncture [[address]] 2)  TLB-Lipid Panel [80061-LIPID] 3)  TLB-BMP (Basic Metabolic Panel-BMET) [80048-METABOL] 4)  TLB-CBC Platelet - w/Differential [85025-CBCD] 5)  TLB-Hepatic/Liver Function Pnl [80076-HEPATIC] 6)  TLB-TSH (Thyroid Stimulating Hormone) [84443-TSH] 7)  Est. Patient 40-64 years [99396] 8)  EKG w/ Interpretation [93000]    Flu Vaccine Result Date:  04/04/2010 Flu Vaccine Result:  given Flu Vaccine Next Due:  1 yr TD Result Date:  03/23/2003 TD Result:  given TD Next Due:  10 yr

## 2010-06-11 ENCOUNTER — Encounter: Payer: Self-pay | Admitting: Family Medicine

## 2010-06-21 NOTE — Letter (Signed)
Summary: [facility]Tdap Vaccine  [facility]   Imported By: Maryln Gottron 06/15/2010 14:00:26  _____________________________________________________________________  External Attachment:    Type:   Image     Comment:   External Document

## 2010-06-21 NOTE — Miscellaneous (Signed)
Summary: Tdap Vaccine/[facility]  Tdap Vaccine/[facility]   Imported By: Maryln Gottron 06/15/2010 14:01:36  _____________________________________________________________________  External Attachment:    Type:   Image     Comment:   External Document  Appended Document: Tdap Vaccine/[facility]    Clinical Lists Changes  Observations: Added new observation of TD BOOSTER: Tdap (06/11/2010 14:17)       Immunization History:  Tetanus/Td Immunization History:    Tetanus/Td:  Tdap (06/11/2010)

## 2010-08-08 ENCOUNTER — Other Ambulatory Visit: Payer: Self-pay

## 2010-08-08 NOTE — Telephone Encounter (Signed)
Last filled 04/05/10 and seen 04/04/10 for Cpx     Please advise     KP

## 2010-08-09 MED ORDER — ZOLPIDEM TARTRATE 10 MG PO TABS: 10.0000 mg | ORAL_TABLET | Freq: Every evening | ORAL | Status: AC | PRN

## 2010-08-09 NOTE — Telephone Encounter (Signed)
RX FAXED    KP 

## 2010-11-22 ENCOUNTER — Telehealth: Payer: Self-pay | Admitting: Family Medicine

## 2010-11-28 ENCOUNTER — Other Ambulatory Visit: Payer: Self-pay | Admitting: Family Medicine

## 2010-11-28 NOTE — Telephone Encounter (Signed)
please advise on med change

## 2010-11-28 NOTE — Telephone Encounter (Signed)
Raymond Decker is on his med list   10 mg #30  1 po qhs  Prn  No refsills

## 2010-11-28 NOTE — Telephone Encounter (Signed)
04/04/10 for Cpx, last filled 08-08-10 #30

## 2010-11-29 MED ORDER — ZOLPIDEM TARTRATE 10 MG PO TABS: 10.0000 mg | ORAL_TABLET | Freq: Every evening | ORAL | Status: DC | PRN

## 2010-11-29 NOTE — Telephone Encounter (Signed)
Rx sent to pharmacy   

## 2011-02-27 ENCOUNTER — Other Ambulatory Visit: Payer: Self-pay

## 2011-02-27 MED ORDER — ZOLPIDEM TARTRATE 10 MG PO TABS: 10.0000 mg | ORAL_TABLET | Freq: Every evening | ORAL | Status: AC | PRN

## 2011-02-27 NOTE — Telephone Encounter (Signed)
Last seen for CPX 04/04/10 and 11/29/10 please advise    KP

## 2011-02-27 NOTE — Telephone Encounter (Signed)
Faxed.   KP 

## 2011-07-25 ENCOUNTER — Telehealth: Payer: Self-pay | Admitting: Family Medicine

## 2011-07-25 NOTE — Telephone Encounter (Signed)
Refill: Zolpidem tartrate 10mg tablet. Take 1 tablet by mouth at bedtime as needed for sleep.  

## 2011-07-25 NOTE — Telephone Encounter (Signed)
Needs ov

## 2011-07-25 NOTE — Telephone Encounter (Signed)
Please call the patient and offer him an apt in order to continue to receive his medication    KP

## 2011-07-25 NOTE — Telephone Encounter (Signed)
Spoke w/pt. He states he has been going to [facility] and will ask them to refill his medication. I advised him to call us back to make an appointment if he still needs Dr. Laury Axon to prescribe his medication.

## 2011-07-25 NOTE — Telephone Encounter (Signed)
Last seen 04/04/2010. Please advise     KP

## 2014-10-24 ENCOUNTER — Encounter: Payer: Self-pay | Admitting: Family Medicine

## 2014-10-24 NOTE — Telephone Encounter (Signed)
Please schedule the patient.     KP 

## 2014-12-01 ENCOUNTER — Ambulatory Visit: Payer: Self-pay | Admitting: Family Medicine

## 2015-02-15 ENCOUNTER — Other Ambulatory Visit: Payer: Self-pay

## 2015-02-15 ENCOUNTER — Ambulatory Visit (INDEPENDENT_AMBULATORY_CARE_PROVIDER_SITE_OTHER): Payer: 59 | Admitting: Gastroenterology

## 2015-02-15 ENCOUNTER — Encounter: Payer: Self-pay | Admitting: Gastroenterology

## 2015-02-15 VITALS — BP 153/92 | HR 66 | Temp 97.9°F | Ht 68.0 in | Wt 184.0 lb

## 2015-02-15 DIAGNOSIS — K219 Gastro-esophageal reflux disease without esophagitis: Secondary | ICD-10-CM

## 2015-02-15 NOTE — Progress Notes (Signed)
Gastroenterology Consultation  Referring Provider:     Lelon Perla, DO Primary Care Physician:  Loreen Freud, DO Primary Gastroenterologist:  Dr. Servando Snare     Reason for Consultation:     Genella Rife        HPI:   Raymond Decker is a 49 y.o. y/o male referred for consultation & management of  Gerd by Dr. Loreen Freud, DO.   This patient comes in today after being seen by his ENT specialist Dr. Jenne Campus for signs of LPR. The patient reports that he has much less heartburn and he has had in the past or signs of reflux since he has been watching what he eats. The patient reports that he has had chronic heartburn for many years and has been on a PPI in the past. His manifestations of his reflux are usually a change in his voice. There is no report of any unexplained weight loss, fevers, chills, nausea or vomiting. The patient also denies any black stools or bloody stools. The patient also states that he has not had a colonoscopy in the past and that is due for one.  Past Medical History  Diagnosis Date  . HYPOGONADISM, MALE 10/27/2006    Qualifier: Diagnosis of  By: Blossom Hoops MD, Luis    . BACK PAIN, LUMBAR 12/18/2007    Qualifier: Diagnosis of  By: Janit Bern    . INSOMNIA 09/30/2006    Qualifier: Diagnosis of  By: Briscoe Burns CMA, Alvy Beal    . HYPERGLYCEMIA 09/30/2006    Qualifier: Diagnosis of  By: Briscoe Burns CMA, Bárbara.Apley      Past Surgical History  Procedure Laterality Date  . Hernia repair    . Neck fusion      15 years ago    Prior to Admission medications   Medication Sig Start Date End Date Taking? Authorizing Provider  ANDROGEL 50 MG/5GM (1%) GEL APPLY 2 TUBES DAILY, 1 TUBE PER SHOULDER 01/16/15  Yes Historical Provider, MD  fluticasone (FLONASE) 50 MCG/ACT nasal spray SPRAY ONCE IN EACH NOSTRIL ONCE DAILY 11/15/14  Yes Historical Provider, MD  omeprazole (PRILOSEC) 40 MG capsule Take 40 mg by mouth daily. 02/03/15  Yes Historical Provider, MD    Family History  Problem Relation Age of  Onset  . Hypertension Father   . Diabetes Father      Social History  Substance Use Topics  . Smoking status: Never Smoker   . Smokeless tobacco: Never Used  . Alcohol Use: Yes     Comment: Occasional    Allergies as of 02/15/2015  . (No Known Allergies)    Review of Systems:    All systems reviewed and negative except where noted in HPI.   Physical Exam:  BP 153/92 mmHg  Pulse 66  Temp(Src) 97.9 F (36.6 C) (Oral)  Ht  (1.727 m)  Wt 184 lb (83.462 kg)  BMI 27.98 kg/m2 No LMP for male patient. Psych:  Alert and cooperative. Normal mood and affect. General:   Alert,  Well-developed, well-nourished, pleasant and cooperative in NAD Head:  Normocephalic and atraumatic. Eyes:  Sclera clear, no icterus.   Conjunctiva pink. Ears:  Normal auditory acuity. Nose:  No deformity, discharge, or lesions. Mouth:  No deformity or lesions,oropharynx pink & moist. Neck:  Supple; no masses or thyromegaly. Lungs:  Respirations even and unlabored.  Clear throughout to auscultation.   No wheezes, crackles, or rhonchi. No acute distress. Heart:  Regular rate and rhythm; no murmurs, clicks, rubs, or gallops.  Abdomen:  Normal bowel sounds.  No bruits.  Soft, non-tender and non-distended without masses, hepatosplenomegaly or hernias noted.  No guarding or rebound tenderness.  Negative Carnett sign.   Rectal:  Deferred.  Msk:  Symmetrical without gross deformities.  Good, equal movement & strength bilaterally. Pulses:  Normal pulses noted. Extremities:  No clubbing or edema.  No cyanosis. Neurologic:  Alert and oriented x3;  grossly normal neurologically. Skin:  Intact without significant lesions or rashes.  No jaundice. Lymph Nodes:  No significant cervical adenopathy. Psych:  Alert and cooperative. Normal mood and affect.  Imaging Studies: No results found.  Assessment and Plan:   Raymond Decker is a 49 y.o. y/o male  Who comes in today with a history of long-standing heartburn. The  patient has also never had a screening colonoscopy. The patient will be set up for a EGD because of his heartburn and change in his voice when he has Genella RifeGerd like symptoms and he will be set up for a colonoscopy due to his need for screening. I have discussed risks & benefits which include, but are not limited to, bleeding, infection, perforation & drug reaction.  The patient agrees with this plan & written consent will be obtained.      Note: This dictation was prepared with Dragon dictation along with smaller phrase technology. Any transcriptional errors that result from this process are unintentional.

## 2015-02-20 ENCOUNTER — Encounter: Payer: Self-pay | Admitting: *Deleted

## 2015-02-23 NOTE — Discharge Instructions (Signed)

## 2015-02-24 ENCOUNTER — Encounter
Admission: RE | Disposition: A | Discharge status: Home / Self Care | Payer: Self-pay | Source: Ambulatory Visit | Attending: Gastroenterology

## 2015-02-24 ENCOUNTER — Ambulatory Visit: Payer: 59 | Admitting: Anesthesiology

## 2015-02-24 ENCOUNTER — Encounter: Payer: Self-pay | Admitting: Anesthesiology

## 2015-02-24 ENCOUNTER — Ambulatory Visit
Admission: RE | Admit: 2015-02-24 | Discharge: 2015-02-24 | Disposition: A | Discharge status: Home / Self Care | Payer: 59 | Source: Ambulatory Visit | Attending: Gastroenterology | Admitting: Gastroenterology

## 2015-02-24 DIAGNOSIS — R739 Hyperglycemia, unspecified: Secondary | ICD-10-CM | POA: Diagnosis not present

## 2015-02-24 DIAGNOSIS — Z833 Family history of diabetes mellitus: Secondary | ICD-10-CM | POA: Insufficient documentation

## 2015-02-24 DIAGNOSIS — Z1211 Encounter for screening for malignant neoplasm of colon: Secondary | ICD-10-CM | POA: Insufficient documentation

## 2015-02-24 DIAGNOSIS — Z8249 Family history of ischemic heart disease and other diseases of the circulatory system: Secondary | ICD-10-CM | POA: Insufficient documentation

## 2015-02-24 DIAGNOSIS — E291 Testicular hypofunction: Secondary | ICD-10-CM | POA: Insufficient documentation

## 2015-02-24 DIAGNOSIS — Z79899 Other long term (current) drug therapy: Secondary | ICD-10-CM | POA: Diagnosis not present

## 2015-02-24 DIAGNOSIS — R12 Heartburn: Secondary | ICD-10-CM | POA: Diagnosis not present

## 2015-02-24 HISTORY — PX: COLONOSCOPY WITH PROPOFOL: SHX5780

## 2015-02-24 HISTORY — PX: ESOPHAGOGASTRODUODENOSCOPY (EGD) WITH PROPOFOL: SHX5813

## 2015-02-24 SURGERY — COLONOSCOPY WITH PROPOFOL
Anesthesia: Monitor Anesthesia Care

## 2015-02-24 MED ORDER — LIDOCAINE HCL (CARDIAC) 20 MG/ML IV SOLN
INTRAVENOUS | Status: DC | PRN
  Administered 2015-02-24: 30 mg via INTRAVENOUS

## 2015-02-24 MED ORDER — GLYCOPYRROLATE 0.2 MG/ML IJ SOLN
INTRAMUSCULAR | Status: DC | PRN
  Administered 2015-02-24: 0.2 mg via INTRAVENOUS

## 2015-02-24 MED ORDER — STERILE WATER FOR IRRIGATION IR SOLN
Status: DC | PRN
  Administered 2015-02-24: 12:00:00

## 2015-02-24 MED ORDER — PROPOFOL 10 MG/ML IV BOLUS
INTRAVENOUS | Status: DC | PRN
  Administered 2015-02-24 (×2): 50 mg via INTRAVENOUS
  Administered 2015-02-24: 100 mg via INTRAVENOUS
  Administered 2015-02-24 (×3): 50 mg via INTRAVENOUS

## 2015-02-24 MED ORDER — LACTATED RINGERS IV SOLN
INTRAVENOUS | Status: DC
  Administered 2015-02-24: 13:00:00 via INTRAVENOUS

## 2015-02-24 SURGICAL SUPPLY — 40 items
BALLN DILATOR 10-12 8 (BALLOONS)
BALLN DILATOR 12-15 8 (BALLOONS)
BALLN DILATOR 15-18 8 (BALLOONS)
BALLN DILATOR CRE 0-12 8 (BALLOONS)
BALLN DILATOR ESOPH 8 10 CRE (MISCELLANEOUS) IMPLANT
BALLOON DILATOR 12-15 8 (BALLOONS) IMPLANT
BALLOON DILATOR 15-18 8 (BALLOONS) IMPLANT
BALLOON DILATOR CRE 0-12 8 (BALLOONS) IMPLANT
BLOCK BITE 60FR ADLT L/F GRN (MISCELLANEOUS) ×2 IMPLANT
CANISTER SUCT 1200ML W/VALVE (MISCELLANEOUS) ×2 IMPLANT
FCP ESCP3.2XJMB 240X2.8X (MISCELLANEOUS)
FORCEPS BIOP RAD 4 LRG CAP 4 (CUTTING FORCEPS) IMPLANT
FORCEPS BIOP RJ4 240 W/NDL (MISCELLANEOUS)
FORCEPS ESCP3.2XJMB 240X2.8X (MISCELLANEOUS) IMPLANT
GOWN CVR UNV OPN BCK APRN NK (MISCELLANEOUS) ×2 IMPLANT
GOWN ISOL THUMB LOOP REG UNIV (MISCELLANEOUS) ×4
HEMOCLIP INSTINCT (CLIP) IMPLANT
INJECTOR VARIJECT VIN23 (MISCELLANEOUS) IMPLANT
KIT CO2 TUBING (TUBING) IMPLANT
KIT DEFENDO VALVE AND CONN (KITS) IMPLANT
KIT ENDO PROCEDURE OLY (KITS) ×2 IMPLANT
KIT ROOM TURNOVER OR (KITS) ×2 IMPLANT
LIGATOR MULTIBAND 6SHOOTER MBL (MISCELLANEOUS) IMPLANT
MARKER SPOT ENDO TATTOO 5ML (MISCELLANEOUS) IMPLANT
PAD GROUND ADULT SPLIT (MISCELLANEOUS) IMPLANT
SNARE SHORT THROW 13M SML OVAL (MISCELLANEOUS) IMPLANT
SNARE SHORT THROW 30M LRG OVAL (MISCELLANEOUS) IMPLANT
SPOT EX ENDOSCOPIC TATTOO (MISCELLANEOUS)
SUCTION POLY TRAP 4CHAMBER (MISCELLANEOUS) IMPLANT
SYR INFLATION 60ML (SYRINGE) IMPLANT
TRAP SUCTION POLY (MISCELLANEOUS) IMPLANT
TUBING CONN 6MMX3.1M (TUBING)
TUBING SUCTION CONN 0.25 STRL (TUBING) IMPLANT
UNDERPAD 30X60 958B10 (PK) (MISCELLANEOUS) IMPLANT
VALVE BIOPSY ENDO (VALVE) IMPLANT
VARIJECT INJECTOR VIN23 (MISCELLANEOUS)
WATER AUXILLARY (MISCELLANEOUS) IMPLANT
WATER STERILE IRR 250ML POUR (IV SOLUTION) ×2 IMPLANT
WATER STERILE IRR 500ML POUR (IV SOLUTION) IMPLANT
WIRE CRE 18-20MM 8CM F G (MISCELLANEOUS) IMPLANT

## 2015-02-24 NOTE — Op Note (Signed)
[facility] Gastroenterology Patient Name: Raymond Decker Procedure Date: 02/24/2015 12:33 PM MRN: [id number] Account #: [id number] Date of Birth: May 27, 1965 Admit Type: Outpatient Age: 49 Room: [facility] OR ROOM 01 Gender: Male Note Status: Finalized Procedure:         Upper GI endoscopy Indications:       Heartburn Providers:         Midge Minium, MD Referring MD:      Lelon Perla, MD (Referring MD) Medicines:         Propofol per Anesthesia Complications:     No immediate complications. Procedure:         Pre-Anesthesia Assessment:                    - Prior to the procedure, a History and Physical was                     performed, and patient medications and allergies were                     reviewed. The patient's tolerance of previous anesthesia                     was also reviewed. The risks and benefits of the procedure                     and the sedation options and risks were discussed with the                     patient. All questions were answered, and informed consent                     was obtained. Prior Anticoagulants: The patient has taken                     no previous anticoagulant or antiplatelet agents. ASA                     Grade Assessment: II - A patient with mild systemic                     disease. After reviewing the risks and benefits, the                     patient was deemed in satisfactory condition to undergo                     the procedure.                    After obtaining informed consent, the endoscope was passed                     under direct vision. Throughout the procedure, the                     patient's blood pressure, pulse, and oxygen saturations                     were monitored continuously. The Olympus GIF H180J                     endoscope (S#: [id number]) was introduced through the mouth,  and advanced to the second part of duodenum. The upper GI                     endoscopy was  accomplished without difficulty. The patient                     tolerated the procedure well. Findings:      The esophagus was normal.      The stomach was normal.      The examined duodenum was normal. Impression:        - Normal esophagus.                    - Normal stomach.                    - Normal examined duodenum.                    - No specimens collected. Recommendation:    - Perform a colonoscopy today. Procedure Code(s): --- Professional ---                    [phone], Esophagogastroduodenoscopy, flexible, transoral;                     diagnostic, including collection of specimen(s) by                     brushing or washing, when performed (separate procedure) Diagnosis Code(s): --- Professional ---                    R12, Heartburn CPT copyright 2014 American Medical Association. All rights reserved. The codes documented in this report are preliminary and upon coder review may  be revised to meet current compliance requirements. Midge Miniumarren Sonda Coppens, MD 02/24/2015 12:47:28 PM This report has been signed electronically. Number of Addenda: 0 Note Initiated On: 02/24/2015 12:33 PM Total Procedure Duration: 0 hours 4 minutes 42 seconds       [facility]

## 2015-02-24 NOTE — Anesthesia Postprocedure Evaluation (Signed)
  Anesthesia Post-op Note  Patient: Raymond Decker  Procedure(s) Performed: Procedure(s): COLONOSCOPY WITH PROPOFOL (N/A) ESOPHAGOGASTRODUODENOSCOPY (EGD) WITH PROPOFOL (N/A)  Anesthesia type:MAC  Patient location: PACU  Post pain: Pain level controlled  Post assessment: Post-op Vital signs reviewed, Patient's Cardiovascular Status Stable, Respiratory Function Stable, Patent Airway and No signs of Nausea or vomiting  Post vital signs: Reviewed and stable  Last Vitals:  Filed Vitals:   02/24/15 1300  BP: 99/64  Pulse:   Temp: 36.6 C  Resp:     Level of consciousness: awake, alert  and patient cooperative  Complications: No apparent anesthesia complications

## 2015-02-24 NOTE — Anesthesia Procedure Notes (Signed)
Procedure Name: MAC Performed by: Wiatt Mahabir Pre-anesthesia Checklist: Patient identified, Emergency Drugs available, Suction available, Patient being monitored and Timeout performed Patient Re-evaluated:Patient Re-evaluated prior to inductionOxygen Delivery Method: Nasal cannula       

## 2015-02-24 NOTE — H&P (Signed)
  [facility]  [address] [place], [place] [address] Phone: [phone] Fax : [phone]  Primary Care Physician:  Loreen FreudYvonne Lowne, DO Primary Gastroenterologist:  Dr. Servando SnareWohl  Pre-Procedure History & Physical: HPI:  Raymond Decker is a 49 y.o. male is here for an endoscopy and colonoscopy.   Past Medical History  Diagnosis Date  . HYPOGONADISM, MALE 10/27/2006    Qualifier: Diagnosis of  By: Blossom HoopsAlejandro MD, Luis    . BACK PAIN, LUMBAR 12/18/2007    Qualifier: Diagnosis of  By: Janit BernLowne DO, Yvonne    . INSOMNIA 09/30/2006    Qualifier: Diagnosis of  By: Briscoe BurnsArchie CMA, Alvy BealLakisha    . HYPERGLYCEMIA 09/30/2006    Qualifier: Diagnosis of  By: Briscoe BurnsArchie CMA, Bárbara.ApleyLakisha      Past Surgical History  Procedure Laterality Date  . Hernia repair    . Neck fusion      15 years ago    Prior to Admission medications   Medication Sig Start Date End Date Taking? Authorizing Provider  ANDROGEL 50 MG/5GM (1%) GEL APPLY 2 TUBES DAILY, 1 TUBE PER SHOULDER 01/16/15  Yes Historical Provider, MD  fluticasone (FLONASE) 50 MCG/ACT nasal spray SPRAY ONCE IN EACH NOSTRIL ONCE DAILY 11/15/14  Yes Historical Provider, MD  omeprazole (PRILOSEC) 40 MG capsule Take 40 mg by mouth daily. 02/03/15  Yes Historical Provider, MD    Allergies as of 02/15/2015  . (No Known Allergies)    Family History  Problem Relation Age of Onset  . Hypertension Father   . Diabetes Father     Social History   Social History  . Marital Status: Married    Spouse Name: N/A  . Number of Children: N/A  . Years of Education: N/A   Occupational History  . Not on file.   Social History Main Topics  . Smoking status: Never Smoker   . Smokeless tobacco: Never Used  . Alcohol Use: 1.8 oz/week    3 Cans of beer per week     Comment: Occasional  . Drug Use: No  . Sexual Activity: Not on file   Other Topics Concern  . Not on file   Social History Narrative    Review of Systems: See HPI, otherwise negative  ROS  Physical Exam: Pulse 82  Temp(Src) 97.9 F (36.6 C)  Resp 16  Ht 5\' 8"  (1.727 m)  Wt 178 lb (80.74 kg)  BMI 27.07 kg/m2  SpO2 100% General:   Alert,  pleasant and cooperative in NAD Head:  Normocephalic and atraumatic. Neck:  Supple; no masses or thyromegaly. Lungs:  Clear throughout to auscultation.    Heart:  Regular rate and rhythm. Abdomen:  Soft, nontender and nondistended. Normal bowel sounds, without guarding, and without rebound.   Neurologic:  Alert and  oriented x4;  grossly normal neurologically.  Impression/Plan: Raymond Decker is here for an endoscopy and colonoscopy to be performed for GERD and screening  Risks, benefits, limitations, and alternatives regarding  endoscopy and colonoscopy have been reviewed with the patient.  Questions have been answered.  All parties agreeable.   Darlina Rumpfaren Kanan Sobek, MD  02/24/2015, 12:08 PM

## 2015-02-24 NOTE — Transfer of Care (Signed)
Immediate Anesthesia Transfer of Care Note  Patient: Raymond SayersRobert Decker  Procedure(s) Performed: Procedure(s): COLONOSCOPY WITH PROPOFOL (N/A) ESOPHAGOGASTRODUODENOSCOPY (EGD) WITH PROPOFOL (N/A)  Patient Location: PACU  Anesthesia Type: MAC  Level of Consciousness: awake, alert  and patient cooperative  Airway and Oxygen Therapy: Patient Spontanous Breathing and Patient connected to supplemental oxygen  Post-op Assessment: Post-op Vital signs reviewed, Patient's Cardiovascular Status Stable, Respiratory Function Stable, Patent Airway and No signs of Nausea or vomiting  Post-op Vital Signs: Reviewed and stable  Complications: No apparent anesthesia complications

## 2015-02-24 NOTE — Anesthesia Preprocedure Evaluation (Signed)
Anesthesia Evaluation  Patient identified by MRN, date of birth, ID band Patient awake    Reviewed: Allergy & Precautions, H&P , NPO status , Patient's Chart, lab work & pertinent test results, reviewed documented beta blocker date and time   Airway Mallampati: II  TM Distance: >3 FB Neck ROM: full    Dental no notable dental hx.    Pulmonary neg pulmonary ROS,    Pulmonary exam normal breath sounds clear to auscultation       Cardiovascular Exercise Tolerance: Good negative cardio ROS   Rhythm:regular Rate:Normal     Neuro/Psych negative neurological ROS  negative psych ROS   GI/Hepatic Neg liver ROS, GERD  ,  Endo/Other  negative endocrine ROS  Renal/GU negative Renal ROS  negative genitourinary   Musculoskeletal   Abdominal   Peds  Hematology negative hematology ROS (+)   Anesthesia Other Findings   Reproductive/Obstetrics negative OB ROS                             Anesthesia Physical Anesthesia Plan  ASA: II  Anesthesia Plan: MAC   Post-op Pain Management:    Induction:   Airway Management Planned:   Additional Equipment:   Intra-op Plan:   Post-operative Plan:   Informed Consent: I have reviewed the patients History and Physical, chart, labs and discussed the procedure including the risks, benefits and alternatives for the proposed anesthesia with the patient or authorized representative who has indicated his/her understanding and acceptance.   Dental Advisory Given  Plan Discussed with: CRNA  Anesthesia Plan Comments:         Anesthesia Quick Evaluation  

## 2015-02-24 NOTE — Op Note (Signed)
[facility] Gastroenterology Patient Name: Raymond Decker Procedure Date: 02/24/2015 12:33 PM MRN: [id number] Account #: [id number] Date of Birth: [birth year] Admit Type: Outpatient Age: 49 Room: [facility] OR ROOM 01 Gender: Male Note Status: Finalized Procedure:         Colonoscopy Indications:       Screening for colorectal malignant neoplasm Providers:         Midge Minium, MD Medicines:         Propofol per Anesthesia Complications:     No immediate complications. Procedure:         Pre-Anesthesia Assessment:                    - Prior to the procedure, a History and Physical was                     performed, and patient medications and allergies were                     reviewed. The patient's tolerance of previous anesthesia                     was also reviewed. The risks and benefits of the procedure                     and the sedation options and risks were discussed with the                     patient. All questions were answered, and informed consent                     was obtained. Prior Anticoagulants: The patient has taken                     no previous anticoagulant or antiplatelet agents. ASA                     Grade Assessment: II - A patient with mild systemic                     disease. After reviewing the risks and benefits, the                     patient was deemed in satisfactory condition to undergo                     the procedure.                    After obtaining informed consent, the colonoscope was                     passed under direct vision. Throughout the procedure, the                     patient's blood pressure, pulse, and oxygen saturations                     were monitored continuously. The Olympus CF H180AL                     colonoscope (S#: [id number]) was introduced through the anus                     and advanced to the the cecum, identified by  appendiceal                     orifice and ileocecal valve. The colonoscopy  was performed                     without difficulty. The patient tolerated the procedure                     well. The quality of the bowel preparation was excellent. Findings:      The perianal and digital rectal examinations were normal.      The entire examined colon appeared normal. Impression:        - The entire examined colon is normal.                    - No specimens collected. Recommendation:    - Repeat colonoscopy in 10 years for screening unless any                     change in family history or lower GI problems. Procedure Code(s): --- Professional ---                    [phone], Colonoscopy, flexible; diagnostic, including                     collection of specimen(s) by brushing or washing, when                     performed (separate procedure) Diagnosis Code(s): --- Professional ---                    Z12.11, Encounter for screening for malignant neoplasm of                     colon CPT copyright 2014 American Medical Association. All rights reserved. The codes documented in this report are preliminary and upon coder review may  be revised to meet current compliance requirements. Midge Miniumarren Gaius Ishaq, MD 02/24/2015 12:56:07 PM This report has been signed electronically. Number of Addenda: 0 Note Initiated On: 02/24/2015 12:33 PM Scope Withdrawal Time: 0 hours 6 minutes 9 seconds  Total Procedure Duration: 0 hours 7 minutes 5 seconds       [facility]

## 2015-02-27 ENCOUNTER — Encounter: Payer: Self-pay | Admitting: Gastroenterology

## 2015-08-08 ENCOUNTER — Ambulatory Visit: Payer: Self-pay | Admitting: Family Medicine

## 2015-08-14 ENCOUNTER — Ambulatory Visit (INDEPENDENT_AMBULATORY_CARE_PROVIDER_SITE_OTHER): Payer: BLUE CROSS/BLUE SHIELD | Admitting: Family Medicine

## 2015-08-14 ENCOUNTER — Encounter: Payer: Self-pay | Admitting: Family Medicine

## 2015-08-14 VITALS — BP 124/89 | HR 61 | Temp 98.0°F | Ht 68.0 in | Wt 177.6 lb

## 2015-08-14 DIAGNOSIS — E785 Hyperlipidemia, unspecified: Secondary | ICD-10-CM | POA: Diagnosis not present

## 2015-08-14 NOTE — Patient Instructions (Signed)

## 2015-08-14 NOTE — Progress Notes (Signed)
Patient ID: Raymond SayersRobert Decker, male    DOB: 10/13/1965  Age: 50 y.o. MRN: [id number]    Subjective:  Subjective HPI Raymond SayersRobert Decker presents for f/u cholesterol.  No complaints.   Review of Systems  Constitutional: Negative for diaphoresis, appetite change, fatigue and unexpected weight change.  Eyes: Negative for pain, redness and visual disturbance.  Respiratory: Negative for cough, chest tightness, shortness of breath and wheezing.   Cardiovascular: Negative for chest pain, palpitations and leg swelling.  Endocrine: Negative for cold intolerance, heat intolerance, polydipsia, polyphagia and polyuria.  Genitourinary: Negative for dysuria, frequency and difficulty urinating.  Neurological: Negative for dizziness, light-headedness, numbness and headaches.    History Past Medical History  Diagnosis Date  . HYPOGONADISM, MALE 10/27/2006    Qualifier: Diagnosis of  By: Blossom HoopsAlejandro MD, Raymond    . BACK PAIN, LUMBAR 12/18/2007    Qualifier: Diagnosis of  By: Raymond BernLowne DO, Raymond Raymond    . INSOMNIA 09/30/2006    Qualifier: Diagnosis of  By: Raymond BurnsArchie Decker, Raymond BealLakisha    . HYPERGLYCEMIA 09/30/2006    Qualifier: Diagnosis of  By: Raymond BurnsArchie Decker, Raymond BealLakisha      He has past surgical history that includes Hernia repair; Neck fusion; Colonoscopy with propofol (N/A, 02/24/2015); and Esophagogastroduodenoscopy (egd) with propofol (N/A, 02/24/2015).   His family history includes Cancer (age of onset: 6171) in his mother; Diabetes in his father; Heart disease in his father; Hypertension in his father.He reports that he has never smoked. He has never used smokeless tobacco. He reports that he drinks about 1.8 oz of alcohol per week. He reports that he does not use illicit drugs.  Current Outpatient Prescriptions on File Prior to Visit  Medication Sig Dispense Refill  . ANDROGEL 50 MG/5GM (1%) GEL APPLY 2 TUBES DAILY, 1 TUBE PER SHOULDER  3  . fluticasone (FLONASE) 50 MCG/ACT nasal spray SPRAY ONCE IN EACH NOSTRIL ONCE DAILY  12  .  omeprazole (PRILOSEC) 40 MG capsule Take 40 mg by mouth daily.  12   No current facility-administered medications on file prior to visit.     Objective:  Objective Physical Exam  Constitutional: He is oriented to person, place, and time. Vital signs are normal. He appears well-developed and well-nourished. He is sleeping. No distress.  HENT:  Head: Normocephalic and atraumatic.  Mouth/Throat: Oropharynx is clear and moist.  Eyes: EOM are normal. Pupils are equal, round, and reactive to light.  Neck: Normal range of motion. Neck supple. No thyromegaly present.  Cardiovascular: Normal rate, regular rhythm and normal heart sounds.   No murmur heard. Pulmonary/Chest: Effort normal and breath sounds normal. No respiratory distress. He has no wheezes. He has no rales. He exhibits no tenderness.  Musculoskeletal: He exhibits no edema or tenderness.  Neurological: He is alert and oriented to person, place, and time.  Skin: Skin is warm and dry.  Psychiatric: He has a normal mood and affect. His behavior is normal. Judgment and thought content normal.  Nursing note and vitals reviewed.  BP 124/89 mmHg  Pulse 61  Temp(Src) 98 F (36.7 C) (Oral)  Ht 5\' 8"  (1.727 m)  Wt 177 lb 9.6 oz (80.559 kg)  BMI 27.01 kg/m2  SpO2 97% Wt Readings from Last 3 Encounters:  08/14/15 177 lb 9.6 oz (80.559 kg)  02/24/15 178 lb (80.74 kg)  02/15/15 184 lb (83.462 kg)     Lab Results  Component Value Date   WBC 6.9 04/04/2010   HGB 14.7 04/04/2010   HCT 42.3 04/04/2010  PLT 291.0 04/04/2010   GLUCOSE 91 08/14/2015   CHOL 206* 08/14/2015   TRIG 125.0 08/14/2015   HDL 52.30 08/14/2015   LDLDIRECT 152.7 04/04/2010   LDLCALC 128* 08/14/2015   ALT 28 08/14/2015   AST 19 08/14/2015   NA 140 08/14/2015   K 4.0 08/14/2015   CL 105 08/14/2015   CREATININE 1.20 08/14/2015   BUN 18 08/14/2015   CO2 30 08/14/2015   TSH 1.33 04/04/2010   PSA 0.54 12/18/2007    No results found.   Assessment &  Plan:  Plan I am having Mr. Fails maintain his fluticasone, omeprazole, and ANDROGEL.  No orders of the defined types were placed in this encounter.    Problem List Items Addressed This Visit    None    Visit Diagnoses    Hyperlipidemia LDL goal <100    -  Primary    Relevant Orders    Lipid panel (Completed)    Comprehensive metabolic panel (Completed)     check labs  Follow-up: Return if symptoms worsen or fail to improve, for annual exam, fasting.  Raymond Schultz, DO

## 2015-08-14 NOTE — Progress Notes (Signed)
Pre visit review using our clinic review tool, if applicable. No additional management support is needed unless otherwise documented below in the visit note. 

## 2015-08-15 LAB — LIPID PANEL
Cholesterol: 206 mg/dL — ABNORMAL HIGH (ref 0–200)
HDL: 52.3 mg/dL (ref >39.00)
LDL Cholesterol: 128 mg/dL — ABNORMAL HIGH (ref 0–99)
NonHDL: 153.31
Total CHOL/HDL Ratio: 4
Triglycerides: 125 mg/dL (ref 0.0–149.0)
VLDL: 25 mg/dL (ref 0.0–40.0)

## 2015-08-15 LAB — COMPREHENSIVE METABOLIC PANEL
ALT: 28 U/L (ref 0–53)
AST: 19 U/L (ref 0–37)
Albumin: 4.5 g/dL (ref 3.5–5.2)
Alkaline Phosphatase: 46 U/L (ref 39–117)
BUN: 18 mg/dL (ref 6–23)
CO2: 30 mEq/L (ref 19–32)
Calcium: 9.8 mg/dL (ref 8.4–10.5)
Chloride: 105 mEq/L (ref 96–112)
Creatinine, Ser: 1.2 mg/dL (ref 0.40–1.50)
GFR: 67.62 mL/min (ref >60.00)
Glucose, Bld: 91 mg/dL (ref 70–99)
Potassium: 4 mEq/L (ref 3.5–5.1)
Sodium: 140 mEq/L (ref 135–145)
Total Bilirubin: 0.6 mg/dL (ref 0.2–1.2)
Total Protein: 7.1 g/dL (ref 6.0–8.3)

## 2015-11-21 ENCOUNTER — Encounter: Payer: BLUE CROSS/BLUE SHIELD | Admitting: Family Medicine

## 2016-01-05 ENCOUNTER — Encounter: Payer: Self-pay | Admitting: Family Medicine

## 2016-03-01 ENCOUNTER — Ambulatory Visit (INDEPENDENT_AMBULATORY_CARE_PROVIDER_SITE_OTHER): Payer: BLUE CROSS/BLUE SHIELD | Admitting: Family Medicine

## 2016-03-01 ENCOUNTER — Encounter: Payer: Self-pay | Admitting: Family Medicine

## 2016-03-01 VITALS — BP 115/83 | HR 80 | Temp 98.3°F | Resp 16 | Ht 68.0 in | Wt 183.2 lb

## 2016-03-01 DIAGNOSIS — Z1159 Encounter for screening for other viral diseases: Secondary | ICD-10-CM

## 2016-03-01 DIAGNOSIS — E785 Hyperlipidemia, unspecified: Secondary | ICD-10-CM | POA: Diagnosis not present

## 2016-03-01 DIAGNOSIS — Z0000 Encounter for general adult medical examination without abnormal findings: Secondary | ICD-10-CM | POA: Insufficient documentation

## 2016-03-01 DIAGNOSIS — Z23 Encounter for immunization: Secondary | ICD-10-CM

## 2016-03-01 LAB — LIPID PANEL
Cholesterol: 228 mg/dL — ABNORMAL HIGH (ref <200)
HDL: 41 mg/dL (ref >40)
LDL Cholesterol: 151 mg/dL — ABNORMAL HIGH (ref <100)
Total CHOL/HDL Ratio: 5.6 Ratio — ABNORMAL HIGH (ref <5.0)
Triglycerides: 181 mg/dL — ABNORMAL HIGH (ref <150)
VLDL: 36 mg/dL — ABNORMAL HIGH (ref <30)

## 2016-03-01 LAB — CBC WITH DIFFERENTIAL/PLATELET
Basophils Absolute: 83 cells/uL (ref 0–200)
Basophils Relative: 1 %
Eosinophils Absolute: 415 cells/uL (ref 15–500)
Eosinophils Relative: 5 %
HCT: 44.5 % (ref 38.5–50.0)
Hemoglobin: 14.9 g/dL (ref 13.2–17.1)
Lymphocytes Relative: 46 %
Lymphs Abs: 3818 cells/uL (ref 850–3900)
MCH: 31.8 pg (ref 27.0–33.0)
MCHC: 33.5 g/dL (ref 32.0–36.0)
MCV: 95.1 fL (ref 80.0–100.0)
MPV: 9.4 fL (ref 7.5–12.5)
Monocytes Absolute: 498 cells/uL (ref 200–950)
Monocytes Relative: 6 %
Neutro Abs: 3486 cells/uL (ref 1500–7800)
Neutrophils Relative %: 42 %
Platelets: 330 10*3/uL (ref 140–400)
RBC: 4.68 MIL/uL (ref 4.20–5.80)
RDW: 13.2 % (ref 11.0–15.0)
WBC: 8.3 10*3/uL (ref 3.8–10.8)

## 2016-03-01 LAB — COMPREHENSIVE METABOLIC PANEL
ALT: 42 U/L (ref 9–46)
AST: 28 U/L (ref 10–35)
Albumin: 4.5 g/dL (ref 3.6–5.1)
Alkaline Phosphatase: 54 U/L (ref 40–115)
BUN: 19 mg/dL (ref 7–25)
CO2: 25 mmol/L (ref 20–31)
Calcium: 9.8 mg/dL (ref 8.6–10.3)
Chloride: 103 mmol/L (ref 98–110)
Creat: 1.21 mg/dL (ref 0.70–1.33)
Glucose, Bld: 89 mg/dL (ref 65–99)
Potassium: 4.3 mmol/L (ref 3.5–5.3)
Sodium: 138 mmol/L (ref 135–146)
Total Bilirubin: 0.5 mg/dL (ref 0.2–1.2)
Total Protein: 7 g/dL (ref 6.1–8.1)

## 2016-03-01 LAB — POCT URINALYSIS DIPSTICK
Bilirubin, UA: NEGATIVE
Blood, UA: NEGATIVE
Glucose, UA: NEGATIVE
Ketones, UA: NEGATIVE
Leukocytes, UA: NEGATIVE
Nitrite, UA: NEGATIVE
Protein, UA: NEGATIVE
Spec Grav, UA: >=1.03
Urobilinogen, UA: 0.2
pH, UA: 6

## 2016-03-01 LAB — TSH: TSH: 1.99 mIU/L (ref 0.40–4.50)

## 2016-03-01 NOTE — Progress Notes (Signed)
Patient ID: Raymond Decker, male    DOB: Dec 26, 1965  Age: 50 y.o. MRN: [id number]    Subjective:  Subjective  HPI Raymond Decker presents for cpe and labs-- pt saw ent and urology within the last few weeks No complaints.   Review of Systems  Constitutional: Negative.   HENT: Negative for congestion, ear pain, hearing loss, nosebleeds, postnasal drip, rhinorrhea, sinus pressure, sneezing and tinnitus.   Eyes: Negative for photophobia, discharge, itching and visual disturbance.  Respiratory: Negative.   Cardiovascular: Negative.   Gastrointestinal: Negative for abdominal distention, abdominal pain, anal bleeding, blood in stool and constipation.  Endocrine: Negative.   Genitourinary: Negative.   Musculoskeletal: Negative.   Skin: Negative.   Allergic/Immunologic: Negative.   Neurological: Negative for dizziness, weakness, light-headedness, numbness and headaches.  Psychiatric/Behavioral: Negative for agitation, confusion, decreased concentration, dysphoric mood, sleep disturbance and suicidal ideas. The patient is not nervous/anxious.     History Past Medical History:  Diagnosis Date  . BACK PAIN, LUMBAR 12/18/2007   Qualifier: Diagnosis of  By: Raymond Decker    . HYPERGLYCEMIA 09/30/2006   Qualifier: Diagnosis of  By: Raymond Decker    . HYPOGONADISM, MALE 10/27/2006   Qualifier: Diagnosis of  By: Raymond Decker, Raymond Decker    . INSOMNIA 09/30/2006   Qualifier: Diagnosis of  By: Raymond Decker      He has a past surgical history that includes Hernia repair; Neck fusion; Colonoscopy with propofol (N/A, 02/24/2015); and Esophagogastroduodenoscopy (egd) with propofol (N/A, 02/24/2015).   His family history includes Cancer (age of onset: 39) in his mother; Diabetes in his father; Heart disease in his father; Hypertension in his father.He reports that he has never smoked. He has never used smokeless tobacco. He reports that he drinks about 1.8 oz of alcohol per week . He reports that he  does not use drugs.  Current Outpatient Prescriptions on File Prior to Visit  Medication Sig Dispense Refill  . ANDROGEL 50 MG/5GM (1%) GEL APPLY 2 TUBES DAILY, 1 TUBE PER SHOULDER  3  . fluticasone (FLONASE) 50 MCG/ACT nasal spray SPRAY ONCE IN EACH NOSTRIL ONCE DAILY  12  . omeprazole (PRILOSEC) 40 MG capsule Take 40 mg by mouth daily.  12   No current facility-administered medications on file prior to visit.      Objective:  Objective  Physical Exam  Constitutional: He is oriented to person, Decker, and time. Vital signs are normal. He appears well-developed and well-nourished. He is sleeping. No distress.  HENT:  Head: Normocephalic and atraumatic.  Right Ear: External ear normal.  Left Ear: External ear normal.  Nose: Nose normal.  Mouth/Throat: Oropharynx is clear and moist. No oropharyngeal exudate.  Eyes: Conjunctivae and EOM are normal. Pupils are equal, round, and reactive to light. Right eye exhibits no discharge. Left eye exhibits no discharge.  Neck: Normal range of motion. Neck supple. No JVD present. No thyromegaly present.  Cardiovascular: Normal rate, regular rhythm and intact distal pulses.  Exam reveals no gallop and no friction rub.   No murmur heard. Pulmonary/Chest: Effort normal and breath sounds normal. No respiratory distress. He has no wheezes. He has no rales. He exhibits no tenderness.  Abdominal: Soft. Bowel sounds are normal. He exhibits no distension and no mass. There is no tenderness. There is no rebound and no guarding.  Musculoskeletal: Normal range of motion. He exhibits no edema or tenderness.  Lymphadenopathy:    He has no cervical adenopathy.  Neurological: He is alert and  oriented to person, Decker, and time. He displays normal reflexes. He exhibits normal muscle tone.  Skin: Skin is warm and dry. No rash noted. He is not diaphoretic. No erythema. No pallor.  Psychiatric: He has a normal mood and affect. His behavior is normal. Judgment and  thought content normal.  Nursing note and vitals reviewed.  BP 115/83 (BP Location: Left Arm, Patient Position: Sitting, Cuff Size: Large)   Pulse 80   Temp 98.3 F (36.8 C) (Oral)   Resp 16   Ht 5' 8"  (1.727 m)   Wt 183 lb 3.2 oz (83.1 kg)   SpO2 95%   BMI 27.86 kg/m  Wt Readings from Last 3 Encounters:  03/01/16 183 lb 3.2 oz (83.1 kg)  08/14/15 177 lb 9.6 oz (80.6 kg)  02/24/15 178 lb (80.7 kg)     Lab Results  Component Value Date   WBC 8.3 03/01/2016   HGB 14.9 03/01/2016   HCT 44.5 03/01/2016   PLT 330 03/01/2016   GLUCOSE 91 08/14/2015   CHOL 206 (H) 08/14/2015   TRIG 125.0 08/14/2015   HDL 52.30 08/14/2015   LDLDIRECT 152.7 04/04/2010   LDLCALC 128 (H) 08/14/2015   ALT 28 08/14/2015   AST 19 08/14/2015   NA 140 08/14/2015   K 4.0 08/14/2015   CL 105 08/14/2015   CREATININE 1.20 08/14/2015   BUN 18 08/14/2015   CO2 30 08/14/2015   TSH 1.33 04/04/2010   PSA 0.54 12/18/2007    No results found.   Assessment & Plan:  Plan  I am having Raymond Decker maintain his fluticasone, omeprazole, and ANDROGEL.  No orders of the defined types were placed in this encounter.   Problem List Items Addressed This Visit      Unprioritized   Preventative health care - Primary    Check labs ghm utd See AVS      Relevant Orders   POCT urinalysis dipstick (Completed)   CBC w/Diff (Completed)   Comp Met (CMET) (Completed)   Lipid panel (Completed)   TSH (Completed)   Hepatitis C Antibody (Completed)    Other Visit Diagnoses    Encounter for immunization       Relevant Orders   Flu Vaccine QUAD 36+ mos IM (Completed)   Hyperlipidemia, unspecified hyperlipidemia type       Relevant Orders   POCT urinalysis dipstick (Completed)   CBC w/Diff (Completed)   Comp Met (CMET) (Completed)   Lipid panel (Completed)   TSH (Completed)   Hepatitis C Antibody (Completed)   Need for hepatitis C screening test       Relevant Orders   CBC w/Diff (Completed)   Comp Met  (CMET) (Completed)   Lipid panel (Completed)   TSH (Completed)   Hepatitis C Antibody (Completed)      Follow-up: Return in about 6 months (around 08/29/2016) for hyperlipidemia.  Ann Held, DO

## 2016-03-01 NOTE — Assessment & Plan Note (Signed)
Check labs ghm utd See AVS 

## 2016-03-01 NOTE — Progress Notes (Signed)
Pre visit review using our clinic review tool, if applicable. No additional management support is needed unless otherwise documented below in the visit note. 

## 2016-03-01 NOTE — Patient Instructions (Signed)
Preventive Care 40-64 Years, Male Preventive care refers to lifestyle choices and visits with your health care provider that can promote health and wellness. What does preventive care include?  A yearly physical exam. This is also called an annual well check.  Dental exams once or twice a year.  Routine eye exams. Ask your health care provider how often you should have your eyes checked.  Personal lifestyle choices, including:  Daily care of your teeth and gums.  Regular physical activity.  Eating a healthy diet.  Avoiding tobacco and drug use.  Limiting alcohol use.  Practicing safe sex.  Taking low-dose aspirin every day starting at age 50. What happens during an annual well check? The services and screenings done by your health care provider during your annual well check will depend on your age, overall health, lifestyle risk factors, and family history of disease. Counseling  Your health care provider may ask you questions about your:  Alcohol use.  Tobacco use.  Drug use.  Emotional well-being.  Home and relationship well-being.  Sexual activity.  Eating habits.  Work and work environment. Screening  You may have the following tests or measurements:  Height, weight, and BMI.  Blood pressure.  Lipid and cholesterol levels. These may be checked every 5 years, or more frequently if you are over 50 years old.  Skin check.  Lung cancer screening. You may have this screening every year starting at age 55 if you have a 30-pack-year history of smoking and currently smoke or have quit within the past 15 years.  Fecal occult blood test (FOBT) of the stool. You may have this test every year starting at age 50.  Flexible sigmoidoscopy or colonoscopy. You may have a sigmoidoscopy every 5 years or a colonoscopy every 10 years starting at age 50.  Prostate cancer screening. Recommendations will vary depending on your family history and other risks.  Hepatitis C  blood test.  Hepatitis B blood test.  Sexually transmitted disease (STD) testing.  Diabetes screening. This is done by checking your blood sugar (glucose) after you have not eaten for a while (fasting). You may have this done every 1-3 years. Discuss your test results, treatment options, and if necessary, the need for more tests with your health care provider. Vaccines  Your health care provider may recommend certain vaccines, such as:  Influenza vaccine. This is recommended every year.  Tetanus, diphtheria, and acellular pertussis (Tdap, Td) vaccine. You may need a Td booster every 10 years.  Varicella vaccine. You may need this if you have not been vaccinated.  Zoster vaccine. You may need this after age 60.  Measles, mumps, and rubella (MMR) vaccine. You may need at least one dose of MMR if you were born in 1957 or later. You may also need a second dose.  Pneumococcal 13-valent conjugate (PCV13) vaccine. You may need this if you have certain conditions and have not been vaccinated.  Pneumococcal polysaccharide (PPSV23) vaccine. You may need one or two doses if you smoke cigarettes or if you have certain conditions.  Meningococcal vaccine. You may need this if you have certain conditions.  Hepatitis A vaccine. You may need this if you have certain conditions or if you travel or work in places where you may be exposed to hepatitis A.  Hepatitis B vaccine. You may need this if you have certain conditions or if you travel or work in places where you may be exposed to hepatitis B.  Haemophilus influenzae type b (Hib)   vaccine. You may need this if you have certain risk factors. Talk to your health care provider about which screenings and vaccines you need and how often you need them. This information is not intended to replace advice given to you by your health care provider. Make sure you discuss any questions you have with your health care provider. Document Released: 04/28/2015  Document Revised: 12/20/2015 Document Reviewed: 01/31/2015 Elsevier Interactive Patient Education  2017 [employer].

## 2016-03-02 LAB — HEPATITIS C ANTIBODY: HCV Ab: NEGATIVE

## 2016-08-30 ENCOUNTER — Ambulatory Visit: Payer: BLUE CROSS/BLUE SHIELD | Admitting: Family Medicine

## 2016-09-02 ENCOUNTER — Ambulatory Visit: Payer: Self-pay | Admitting: Family Medicine

## 2016-09-02 ENCOUNTER — Encounter: Payer: Self-pay | Admitting: Family Medicine

## 2016-09-02 ENCOUNTER — Ambulatory Visit (INDEPENDENT_AMBULATORY_CARE_PROVIDER_SITE_OTHER): Payer: BLUE CROSS/BLUE SHIELD | Admitting: Family Medicine

## 2016-09-02 VITALS — BP 110/90 | HR 73 | Temp 97.9°F | Ht 68.0 in | Wt 190.8 lb

## 2016-09-02 DIAGNOSIS — E785 Hyperlipidemia, unspecified: Secondary | ICD-10-CM

## 2016-09-02 DIAGNOSIS — J301 Allergic rhinitis due to pollen: Secondary | ICD-10-CM

## 2016-09-02 DIAGNOSIS — J302 Other seasonal allergic rhinitis: Secondary | ICD-10-CM | POA: Insufficient documentation

## 2016-09-02 NOTE — Patient Instructions (Signed)

## 2016-09-02 NOTE — Progress Notes (Signed)
Patient ID: Raymond Decker, male    DOB: 22-Feb-1966  Age: 51 y.o. MRN: [id number]    Subjective:  Subjective  HPI Fernie Grimm presents for f/u hyperlipidemia Pt c/o ears feeling clogged at times-- -not today but he wonders if there is something he can do about it to prevent it   Review of Systems  Constitutional: Negative for appetite change, diaphoresis, fatigue and unexpected weight change.  Eyes: Negative for pain, redness and visual disturbance.  Respiratory: Negative for cough, chest tightness, shortness of breath and wheezing.   Cardiovascular: Negative for chest pain, palpitations and leg swelling.  Endocrine: Negative for cold intolerance, heat intolerance, polydipsia, polyphagia and polyuria.  Genitourinary: Negative for difficulty urinating, dysuria and frequency.  Neurological: Negative for dizziness, light-headedness, numbness and headaches.    History Past Medical History:  Diagnosis Date  . BACK PAIN, LUMBAR 12/18/2007   Qualifier: Diagnosis of  By: Janit Bern    . HYPERGLYCEMIA 09/30/2006   Qualifier: Diagnosis of  By: Briscoe Burns CMA, Alvy Beal    . HYPOGONADISM, MALE 10/27/2006   Qualifier: Diagnosis of  By: Blossom Hoops MD, Luis    . INSOMNIA 09/30/2006   Qualifier: Diagnosis of  By: Briscoe Burns CMA, Alvy Beal      He has a past surgical history that includes Hernia repair; Neck fusion; Colonoscopy with propofol (N/A, 02/24/2015); and Esophagogastroduodenoscopy (egd) with propofol (N/A, 02/24/2015).   His family history includes Cancer (age of onset: 66) in his mother; Diabetes in his father; Heart disease in his father; Hypertension in his father.He reports that he has never smoked. He has never used smokeless tobacco. He reports that he drinks about 1.8 oz of alcohol per week . He reports that he does not use drugs.  Current Outpatient Prescriptions on File Prior to Visit  Medication Sig Dispense Refill  . ANDROGEL 50 MG/5GM (1%) GEL APPLY 2 TUBES DAILY, 1 TUBE PER SHOULDER  3    . fluticasone (FLONASE) 50 MCG/ACT nasal spray SPRAY ONCE IN EACH NOSTRIL ONCE DAILY  12  . omeprazole (PRILOSEC) 40 MG capsule Take 40 mg by mouth daily.  12   No current facility-administered medications on file prior to visit.      Objective:  Objective  Physical Exam  Constitutional: He is oriented to person, place, and time. Vital signs are normal. He appears well-developed and well-nourished. He is sleeping.  HENT:  Head: Normocephalic and atraumatic.  Right Ear: Hearing, tympanic membrane, external ear and ear canal normal.  Left Ear: Hearing, tympanic membrane, external ear and ear canal normal.  Mouth/Throat: Oropharynx is clear and moist.  Eyes: EOM are normal. Pupils are equal, round, and reactive to light.  Neck: Normal range of motion. Neck supple. No thyromegaly present.  Cardiovascular: Normal rate and regular rhythm.   No murmur heard. Pulmonary/Chest: Effort normal and breath sounds normal. No respiratory distress. He has no wheezes. He has no rales. He exhibits no tenderness.  Musculoskeletal: He exhibits no edema or tenderness.  Neurological: He is alert and oriented to person, place, and time.  Skin: Skin is warm and dry.  Psychiatric: He has a normal mood and affect. His behavior is normal. Judgment and thought content normal.  Nursing note and vitals reviewed.  BP 110/90 (BP Location: Left Arm, Patient Position: Sitting, Cuff Size: Large)   Pulse 73   Temp 97.9 F (36.6 C) (Oral)   Ht 5\' 8"  (1.727 m)   Wt 190 lb 12.8 oz (86.5 kg)   SpO2 97%  BMI 29.01 kg/m  Wt Readings from Last 3 Encounters:  09/02/16 190 lb 12.8 oz (86.5 kg)  03/01/16 183 lb 3.2 oz (83.1 kg)  08/14/15 177 lb 9.6 oz (80.6 kg)     Lab Results  Component Value Date   WBC 8.3 03/01/2016   HGB 14.9 03/01/2016   HCT 44.5 03/01/2016   PLT 330 03/01/2016   GLUCOSE 89 03/01/2016   CHOL 228 (H) 03/01/2016   TRIG 181 (H) 03/01/2016   HDL 41 03/01/2016   LDLDIRECT 152.7 04/04/2010    LDLCALC 151 (H) 03/01/2016   ALT 42 03/01/2016   AST 28 03/01/2016   NA 138 03/01/2016   K 4.3 03/01/2016   CL 103 03/01/2016   CREATININE 1.21 03/01/2016   BUN 19 03/01/2016   CO2 25 03/01/2016   TSH 1.99 03/01/2016   PSA 0.54 12/18/2007    No results found.   Assessment & Plan:  Plan  I am having Mr. Guy SandiferLipke maintain his fluticasone, omeprazole, and ANDROGEL.  No orders of the defined types were placed in this encounter.   Problem List Items Addressed This Visit      Unprioritized   Seasonal allergic rhinitis due to pollen    Use otc antihistamine and steroid nasal spray prn Call or rto if symptoms persist        Other Visit Diagnoses    Hyperlipidemia LDL goal <100    -  Primary   Relevant Orders   Lipid panel   Comprehensive metabolic panel      Follow-up: Return in about 6 months (around 03/05/2017) for hyperlipidemia, annual exam.  Donato SchultzYvonne R Lowne Chase, DO

## 2016-09-02 NOTE — Assessment & Plan Note (Signed)
Use otc antihistamine and steroid nasal spray prn Call or rto if symptoms persist

## 2016-09-03 LAB — LIPID PANEL
Cholesterol: 266 mg/dL — ABNORMAL HIGH (ref 0–200)
HDL: 52.4 mg/dL (ref >39.00)
NonHDL: 213.18
Total CHOL/HDL Ratio: 5
Triglycerides: 217 mg/dL — ABNORMAL HIGH (ref 0.0–149.0)
VLDL: 43.4 mg/dL — ABNORMAL HIGH (ref 0.0–40.0)

## 2016-09-03 LAB — COMPREHENSIVE METABOLIC PANEL
ALT: 40 U/L (ref 0–53)
AST: 23 U/L (ref 0–37)
Albumin: 4.6 g/dL (ref 3.5–5.2)
Alkaline Phosphatase: 41 U/L (ref 39–117)
BUN: 21 mg/dL (ref 6–23)
CO2: 27 mEq/L (ref 19–32)
Calcium: 9.8 mg/dL (ref 8.4–10.5)
Chloride: 103 mEq/L (ref 96–112)
Creatinine, Ser: 1.2 mg/dL (ref 0.40–1.50)
GFR: 67.35 mL/min (ref >60.00)
Glucose, Bld: 91 mg/dL (ref 70–99)
Potassium: 4.1 mEq/L (ref 3.5–5.1)
Sodium: 138 mEq/L (ref 135–145)
Total Bilirubin: 0.6 mg/dL (ref 0.2–1.2)
Total Protein: 7.2 g/dL (ref 6.0–8.3)

## 2016-09-03 LAB — LDL CHOLESTEROL, DIRECT: Direct LDL: 166 mg/dL

## 2016-09-05 ENCOUNTER — Other Ambulatory Visit: Payer: Self-pay | Admitting: Family Medicine

## 2016-09-05 DIAGNOSIS — E785 Hyperlipidemia, unspecified: Secondary | ICD-10-CM

## 2016-09-05 MED ORDER — ATORVASTATIN CALCIUM 20 MG PO TABS: 20.0000 mg | ORAL_TABLET | Freq: Every day | ORAL | 3 refills | Status: DC

## 2016-10-11 ENCOUNTER — Encounter: Payer: Self-pay | Admitting: Family Medicine

## 2016-12-24 ENCOUNTER — Other Ambulatory Visit (INDEPENDENT_AMBULATORY_CARE_PROVIDER_SITE_OTHER): Payer: BLUE CROSS/BLUE SHIELD

## 2016-12-24 DIAGNOSIS — E785 Hyperlipidemia, unspecified: Secondary | ICD-10-CM

## 2016-12-25 LAB — LIPID PANEL
Cholesterol: 166 mg/dL (ref 0–200)
HDL: 66.7 mg/dL (ref >39.00)
LDL Cholesterol: 75 mg/dL (ref 0–99)
NonHDL: 98.89
Total CHOL/HDL Ratio: 2
Triglycerides: 121 mg/dL (ref 0.0–149.0)
VLDL: 24.2 mg/dL (ref 0.0–40.0)

## 2016-12-25 LAB — COMPREHENSIVE METABOLIC PANEL
ALT: 48 U/L (ref 0–53)
AST: 25 U/L (ref 0–37)
Albumin: 4.4 g/dL (ref 3.5–5.2)
Alkaline Phosphatase: 43 U/L (ref 39–117)
BUN: 19 mg/dL (ref 6–23)
CO2: 26 mEq/L (ref 19–32)
Calcium: 9.6 mg/dL (ref 8.4–10.5)
Chloride: 103 mEq/L (ref 96–112)
Creatinine, Ser: 1.21 mg/dL (ref 0.40–1.50)
GFR: 66.63 mL/min (ref >60.00)
Glucose, Bld: 84 mg/dL (ref 70–99)
Potassium: 4.1 mEq/L (ref 3.5–5.1)
Sodium: 138 mEq/L (ref 135–145)
Total Bilirubin: 0.7 mg/dL (ref 0.2–1.2)
Total Protein: 6.9 g/dL (ref 6.0–8.3)

## 2016-12-26 ENCOUNTER — Ambulatory Visit: Payer: BLUE CROSS/BLUE SHIELD

## 2016-12-27 ENCOUNTER — Other Ambulatory Visit: Payer: BLUE CROSS/BLUE SHIELD

## 2016-12-27 ENCOUNTER — Other Ambulatory Visit: Payer: Self-pay | Admitting: Family Medicine

## 2016-12-30 ENCOUNTER — Encounter: Payer: Self-pay | Admitting: Family Medicine

## 2016-12-30 ENCOUNTER — Other Ambulatory Visit: Payer: Self-pay | Admitting: Family Medicine

## 2016-12-30 DIAGNOSIS — E785 Hyperlipidemia, unspecified: Secondary | ICD-10-CM

## 2016-12-30 MED ORDER — ATORVASTATIN CALCIUM 20 MG PO TABS: 20.0000 mg | ORAL_TABLET | Freq: Every day | ORAL | 3 refills | Status: DC

## 2016-12-30 NOTE — Telephone Encounter (Signed)
He needs to con't the statin for now-- I refilled it  It seems to be working  Usually people need to stay on it unless there is a significant change and we can decrease it

## 2017-01-02 ENCOUNTER — Ambulatory Visit: Payer: Self-pay | Admitting: Family Medicine

## 2017-01-07 ENCOUNTER — Ambulatory Visit: Payer: Self-pay | Admitting: Family Medicine

## 2017-07-24 ENCOUNTER — Other Ambulatory Visit: Payer: Self-pay | Admitting: Family Medicine

## 2017-07-24 DIAGNOSIS — E785 Hyperlipidemia, unspecified: Secondary | ICD-10-CM

## 2017-08-20 ENCOUNTER — Other Ambulatory Visit: Payer: Self-pay | Admitting: Family Medicine

## 2017-08-20 DIAGNOSIS — E785 Hyperlipidemia, unspecified: Secondary | ICD-10-CM

## 2017-09-19 ENCOUNTER — Other Ambulatory Visit: Payer: Self-pay | Admitting: Family Medicine

## 2017-09-19 DIAGNOSIS — E785 Hyperlipidemia, unspecified: Secondary | ICD-10-CM

## 2017-09-23 ENCOUNTER — Other Ambulatory Visit: Payer: Self-pay | Admitting: Family Medicine

## 2017-09-23 DIAGNOSIS — E785 Hyperlipidemia, unspecified: Secondary | ICD-10-CM

## 2018-03-02 ENCOUNTER — Other Ambulatory Visit: Payer: Self-pay | Admitting: Family Medicine

## 2018-03-16 ENCOUNTER — Other Ambulatory Visit: Payer: Self-pay | Admitting: Family Medicine

## 2018-03-17 ENCOUNTER — Other Ambulatory Visit: Payer: Self-pay | Admitting: Family Medicine

## 2019-04-30 LAB — PSA: PSA: 0.68

## 2019-04-30 LAB — TESTOSTERONE: Testosterone: 335.3
